# Patient Record
Sex: Female | Born: 1970
Health system: Southern US, Community
[De-identification: ages and names within clinical notes are randomized; demographics above are authoritative.]

## PROBLEM LIST (undated history)

## (undated) DIAGNOSIS — R51 Headache: Secondary | ICD-10-CM

## (undated) DIAGNOSIS — O24419 Gestational diabetes mellitus in pregnancy, unspecified control: Secondary | ICD-10-CM

## (undated) DIAGNOSIS — I10 Essential (primary) hypertension: Secondary | ICD-10-CM

## (undated) DIAGNOSIS — R519 Headache, unspecified: Secondary | ICD-10-CM

## (undated) DIAGNOSIS — G4762 Sleep related leg cramps: Secondary | ICD-10-CM

## (undated) HISTORY — PX: OTHER SURGICAL HISTORY: SHX169

## (undated) HISTORY — DX: Gestational diabetes mellitus in pregnancy, unspecified control: O24.419

## (undated) HISTORY — DX: Essential (primary) hypertension: I10

---
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
## 1999-03-15 ENCOUNTER — Emergency Department (HOSPITAL_COMMUNITY): Admission: EM | Admit: 1999-03-15 | Discharge: 1999-03-16 | Payer: Self-pay | Admitting: Emergency Medicine

## 1999-03-15 ENCOUNTER — Emergency Department (HOSPITAL_COMMUNITY): Admission: EM | Admit: 1999-03-15 | Discharge: 1999-03-15 | Payer: Self-pay | Admitting: Emergency Medicine

## 2001-02-04 ENCOUNTER — Inpatient Hospital Stay (HOSPITAL_COMMUNITY): Admission: AD | Admit: 2001-02-04 | Discharge: 2001-02-04 | Payer: Self-pay | Admitting: Obstetrics

## 2002-06-07 ENCOUNTER — Emergency Department (HOSPITAL_COMMUNITY): Admission: EM | Admit: 2002-06-07 | Discharge: 2002-06-08 | Payer: Self-pay | Admitting: Emergency Medicine

## 2002-06-11 ENCOUNTER — Observation Stay (HOSPITAL_COMMUNITY): Admission: AD | Admit: 2002-06-11 | Discharge: 2002-06-12 | Payer: Self-pay | Admitting: *Deleted

## 2002-08-19 ENCOUNTER — Ambulatory Visit (HOSPITAL_COMMUNITY): Admission: RE | Admit: 2002-08-19 | Discharge: 2002-08-19 | Payer: Self-pay | Admitting: *Deleted

## 2002-08-19 ENCOUNTER — Encounter: Payer: Self-pay | Admitting: *Deleted

## 2002-09-21 ENCOUNTER — Ambulatory Visit (HOSPITAL_COMMUNITY): Admission: RE | Admit: 2002-09-21 | Discharge: 2002-09-21 | Payer: Self-pay | Admitting: *Deleted

## 2002-09-21 ENCOUNTER — Encounter: Payer: Self-pay | Admitting: *Deleted

## 2002-11-01 ENCOUNTER — Encounter: Payer: Self-pay | Admitting: *Deleted

## 2002-11-01 ENCOUNTER — Ambulatory Visit (HOSPITAL_COMMUNITY): Admission: RE | Admit: 2002-11-01 | Discharge: 2002-11-01 | Payer: Self-pay | Admitting: *Deleted

## 2002-12-03 ENCOUNTER — Ambulatory Visit (HOSPITAL_COMMUNITY): Admission: RE | Admit: 2002-12-03 | Discharge: 2002-12-03 | Payer: Self-pay | Admitting: *Deleted

## 2002-12-03 ENCOUNTER — Encounter: Payer: Self-pay | Admitting: *Deleted

## 2002-12-29 ENCOUNTER — Ambulatory Visit (HOSPITAL_COMMUNITY): Admission: AD | Admit: 2002-12-29 | Discharge: 2002-12-29 | Payer: Self-pay | Admitting: *Deleted

## 2002-12-31 ENCOUNTER — Ambulatory Visit (HOSPITAL_COMMUNITY): Admission: AD | Admit: 2002-12-31 | Discharge: 2002-12-31 | Payer: Self-pay | Admitting: *Deleted

## 2003-01-07 ENCOUNTER — Ambulatory Visit (HOSPITAL_COMMUNITY): Admission: AD | Admit: 2003-01-07 | Discharge: 2003-01-08 | Payer: Self-pay | Admitting: *Deleted

## 2003-01-08 ENCOUNTER — Inpatient Hospital Stay (HOSPITAL_COMMUNITY): Admission: RE | Admit: 2003-01-08 | Discharge: 2003-01-11 | Payer: Self-pay | Admitting: *Deleted

## 2003-01-28 ENCOUNTER — Emergency Department (HOSPITAL_COMMUNITY): Admission: EM | Admit: 2003-01-28 | Discharge: 2003-01-28 | Payer: Self-pay | Admitting: Emergency Medicine

## 2003-01-28 ENCOUNTER — Encounter: Payer: Self-pay | Admitting: *Deleted

## 2003-02-07 ENCOUNTER — Emergency Department (HOSPITAL_COMMUNITY): Admission: EM | Admit: 2003-02-07 | Discharge: 2003-02-07 | Payer: Self-pay | Admitting: Emergency Medicine

## 2003-06-21 ENCOUNTER — Ambulatory Visit (HOSPITAL_COMMUNITY): Admission: RE | Admit: 2003-06-21 | Discharge: 2003-06-21 | Payer: Self-pay | Admitting: Family Medicine

## 2013-03-31 ENCOUNTER — Telehealth: Payer: Self-pay | Admitting: Family Medicine

## 2013-03-31 MED ORDER — ONDANSETRON HCL 8 MG PO TABS: 8.0000 mg | ORAL_TABLET | Freq: Three times a day (TID) | ORAL | Status: DC | PRN

## 2013-03-31 NOTE — Telephone Encounter (Signed)
Patient wants to know if you will prescribe her some medication for the nausea she is experiencing due to the hydrocodone she is taking. [facility] [place]

## 2013-03-31 NOTE — Telephone Encounter (Signed)
zofran 8 mg one tid prn ,#21,2 refills, follow up if ongoing

## 2013-03-31 NOTE — Telephone Encounter (Signed)
Medication sent to pharmacy. Patient was notified.  

## 2013-03-31 NOTE — Telephone Encounter (Signed)
Left message on voicemail to return call.

## 2013-03-31 NOTE — Telephone Encounter (Signed)
Wants to talk with a Nurse regarding her past ER visit.  She is currently taking Hydrocodone and it making her sick.  She fell and injured her right foot.  Went to [facility].  Please call Patient. Thanks

## 2013-04-01 ENCOUNTER — Other Ambulatory Visit: Payer: Self-pay | Admitting: *Deleted

## 2013-04-01 MED ORDER — PROMETHAZINE HCL 25 MG PO TABS: 25.0000 mg | ORAL_TABLET | Freq: Three times a day (TID) | ORAL | Status: DC | PRN

## 2013-04-01 NOTE — Telephone Encounter (Signed)
Patient called back stating she could not afford Zofran. Phenergan was prescribed and sent in by the nurse.

## 2016-03-19 ENCOUNTER — Encounter: Payer: Self-pay | Admitting: Family Medicine

## 2016-03-19 ENCOUNTER — Ambulatory Visit (INDEPENDENT_AMBULATORY_CARE_PROVIDER_SITE_OTHER): Payer: Self-pay | Admitting: Family Medicine

## 2016-03-19 VITALS — BP 120/90 | Ht 62.0 in | Wt 137.0 lb

## 2016-03-19 DIAGNOSIS — R079 Chest pain, unspecified: Secondary | ICD-10-CM

## 2016-03-19 DIAGNOSIS — Z13220 Encounter for screening for lipoid disorders: Secondary | ICD-10-CM

## 2016-03-19 NOTE — Progress Notes (Signed)
   Subjective:    Patient ID: Samantha Moreno, female    DOB: Nov 10, 1970, 45 y.o.   MRN: [id number]  Chest Pain   This is a new problem. Episode onset: one month. Radiates to: left arm numbness, fatigue, palpitations, shaky,  headache. Pertinent negatives include no cough, fever, headaches or shortness of breath.   Patient states at times she has tightness in her chest radiates into the left chest indication he makes C-arm feel funny. She denies any nausea vomiting diarrhea fever chills or sweats.  She has a strong family history of premature heart disease her sister died at age 54 her mother had bypass in her 32s her dad had a heart attack in his 19s the patient does not smoke or drink.   Review of Systems  Constitutional: Negative for activity change, fatigue and fever.  Respiratory: Negative for cough and shortness of breath.   Cardiovascular: Positive for chest pain. Negative for leg swelling.  Neurological: Negative for headaches.       Objective:   Physical Exam  Constitutional: She appears well-nourished. No distress.  Cardiovascular: Normal rate, regular rhythm and normal heart sounds.   No murmur heard. Pulmonary/Chest: Effort normal and breath sounds normal. No respiratory distress.  Musculoskeletal: She exhibits no edema.  Lymphadenopathy:    She has no cervical adenopathy.  Neurological: She is alert. She exhibits normal muscle tone.  Psychiatric: Her behavior is normal.  Vitals reviewed.         Assessment & Plan:  Chest pain although cardiac is less likely it could well be stress, because of family history and her symptoms referral to cardiology for further evaluation and stress testing is recommended.  Borderline blood pressure low-salt diet regular physical activity once she is cardiac cleared. Monitor blood pressure periodically at home if it is staying in the 90s or if the top number gets in one dirty's/140s follow-up sooner  Lipid profile ordered await  results

## 2016-03-19 NOTE — Patient Instructions (Signed)
DASH Eating Plan  DASH stands for "Dietary Approaches to Stop Hypertension." The DASH eating plan is a healthy eating plan that has been shown to reduce high blood pressure (hypertension). Additional health benefits may include reducing the risk of type 2 diabetes mellitus, heart disease, and stroke. The DASH eating plan may also help with weight loss.  WHAT DO I NEED TO KNOW ABOUT THE DASH EATING PLAN?  For the DASH eating plan, you will follow these general guidelines:  · Choose foods with a percent daily value for sodium of less than 5% (as listed on the food label).  · Use salt-free seasonings or herbs instead of table salt or sea salt.  · Check with your health care provider or pharmacist before using salt substitutes.  · Eat lower-sodium products, often labeled as "lower sodium" or "no salt added."  · Eat fresh foods.  · Eat more vegetables, fruits, and low-fat dairy products.  · Choose whole grains. Look for the word "whole" as the first word in the ingredient list.  · Choose fish and skinless chicken or turkey more often than red meat. Limit fish, poultry, and meat to 6 oz (170 g) each day.  · Limit sweets, desserts, sugars, and sugary drinks.  · Choose heart-healthy fats.  · Limit cheese to 1 oz (28 g) per day.  · Eat more home-cooked food and less restaurant, buffet, and fast food.  · Limit fried foods.  · Cook foods using methods other than frying.  · Limit canned vegetables. If you do use them, rinse them well to decrease the sodium.  · When eating at a restaurant, ask that your food be prepared with less salt, or no salt if possible.  WHAT FOODS CAN I EAT?  Seek help from a dietitian for individual calorie needs.  Grains  Whole grain or whole wheat bread. Brown rice. Whole grain or whole wheat pasta. Quinoa, bulgur, and whole grain cereals. Low-sodium cereals. Corn or whole wheat flour tortillas. Whole grain cornbread. Whole grain crackers. Low-sodium crackers.  Vegetables  Fresh or frozen vegetables  (raw, steamed, roasted, or grilled). Low-sodium or reduced-sodium tomato and vegetable juices. Low-sodium or reduced-sodium tomato sauce and paste. Low-sodium or reduced-sodium canned vegetables.   Fruits  All fresh, canned (in natural juice), or frozen fruits.  Meat and Other Protein Products  Ground beef (85% or leaner), grass-fed beef, or beef trimmed of fat. Skinless chicken or turkey. Ground chicken or turkey. Pork trimmed of fat. All fish and seafood. Eggs. Dried beans, peas, or lentils. Unsalted nuts and seeds. Unsalted canned beans.  Dairy  Low-fat dairy products, such as skim or 1% milk, 2% or reduced-fat cheeses, low-fat ricotta or cottage cheese, or plain low-fat yogurt. Low-sodium or reduced-sodium cheeses.  Fats and Oils  Tub margarines without trans fats. Light or reduced-fat mayonnaise and salad dressings (reduced sodium). Avocado. Safflower, olive, or canola oils. Natural peanut or almond butter.  Other  Unsalted popcorn and pretzels.  The items listed above may not be a complete list of recommended foods or beverages. Contact your dietitian for more options.  WHAT FOODS ARE NOT RECOMMENDED?  Grains  White bread. White pasta. White rice. Refined cornbread. Bagels and croissants. Crackers that contain trans fat.  Vegetables  Creamed or fried vegetables. Vegetables in a cheese sauce. Regular canned vegetables. Regular canned tomato sauce and paste. Regular tomato and vegetable juices.  Fruits  Dried fruits. Canned fruit in light or heavy syrup. Fruit juice.  Meat and Other Protein   Products  Fatty cuts of meat. Ribs, chicken wings, bacon, sausage, bologna, salami, chitterlings, fatback, hot dogs, bratwurst, and packaged luncheon meats. Salted nuts and seeds. Canned beans with salt.  Dairy  Whole or 2% milk, cream, half-and-half, and cream cheese. Whole-fat or sweetened yogurt. Full-fat cheeses or blue cheese. Nondairy creamers and whipped toppings. Processed cheese, cheese spreads, or cheese  curds.  Condiments  Onion and garlic salt, seasoned salt, table salt, and sea salt. Canned and packaged gravies. Worcestershire sauce. Tartar sauce. Barbecue sauce. Teriyaki sauce. Soy sauce, including reduced sodium. Steak sauce. Fish sauce. Oyster sauce. Cocktail sauce. Horseradish. Ketchup and mustard. Meat flavorings and tenderizers. Bouillon cubes. Hot sauce. Tabasco sauce. Marinades. Taco seasonings. Relishes.  Fats and Oils  Butter, stick margarine, lard, shortening, ghee, and bacon fat. Coconut, palm kernel, or palm oils. Regular salad dressings.  Other  Pickles and olives. Salted popcorn and pretzels.  The items listed above may not be a complete list of foods and beverages to avoid. Contact your dietitian for more information.  WHERE CAN I FIND MORE INFORMATION?  National Heart, Lung, and Blood Institute: www.nhlbi.nih.gov/health/health-topics/topics/dash/     This information is not intended to replace advice given to you by your health care provider. Make sure you discuss any questions you have with your health care provider.     Document Released: 05/16/2011 Document Revised: 06/17/2014 Document Reviewed: 03/31/2013  Elsevier Interactive Patient Education ©2016 Elsevier Inc.

## 2016-03-21 ENCOUNTER — Telehealth: Payer: Self-pay | Admitting: Family Medicine

## 2016-03-21 ENCOUNTER — Encounter: Payer: Self-pay | Admitting: Family Medicine

## 2016-03-21 DIAGNOSIS — E785 Hyperlipidemia, unspecified: Secondary | ICD-10-CM | POA: Insufficient documentation

## 2016-03-21 DIAGNOSIS — R7301 Impaired fasting glucose: Secondary | ICD-10-CM | POA: Insufficient documentation

## 2016-03-21 LAB — LIPID PANEL
Chol/HDL Ratio: 5.4 ratio units — ABNORMAL HIGH (ref 0.0–4.4)
Cholesterol, Total: 189 mg/dL (ref 100–199)
HDL: 35 mg/dL — ABNORMAL LOW (ref >39)
LDL Calculated: 115 mg/dL — ABNORMAL HIGH (ref 0–99)
Triglycerides: 193 mg/dL — ABNORMAL HIGH (ref 0–149)
VLDL Cholesterol Cal: 39 mg/dL (ref 5–40)

## 2016-03-21 LAB — BASIC METABOLIC PANEL
BUN/Creatinine Ratio: 14 (ref 9–23)
BUN: 10 mg/dL (ref 6–24)
CO2: 24 mmol/L (ref 18–29)
Calcium: 8.9 mg/dL (ref 8.7–10.2)
Chloride: 104 mmol/L (ref 96–106)
Creatinine, Ser: 0.7 mg/dL (ref 0.57–1.00)
GFR calc Af Amer: 121 mL/min/{1.73_m2} (ref >59)
GFR calc non Af Amer: 105 mL/min/{1.73_m2} (ref >59)
Glucose: 105 mg/dL — ABNORMAL HIGH (ref 65–99)
Potassium: 4.4 mmol/L (ref 3.5–5.2)
Sodium: 142 mmol/L (ref 134–144)

## 2016-03-21 NOTE — Telephone Encounter (Signed)
See result notes please

## 2016-03-21 NOTE — Telephone Encounter (Signed)
Requesting results to lab work completed on 03/20/16.

## 2016-03-22 ENCOUNTER — Encounter: Payer: Self-pay | Admitting: Family Medicine

## 2016-03-26 ENCOUNTER — Encounter: Payer: Self-pay | Admitting: Family Medicine

## 2016-04-15 NOTE — Progress Notes (Signed)
Cardiology Office Note   Date:  04/16/2016   ID:  Samantha Moreno, DOB Oct 17, 1970, MRN [id number]  PCP:  Sallee Lange, MD  Cardiologist:   Jenkins Rouge, MD   No chief complaint on file.     History of Present Illness: Samantha Moreno is a 45 y.o. female who presents for evaluation of atypical chest pain.  Seen by Dr Wolfgang Phoenix on 03/19/16  Pain for a month  With radiation to left arm numbness , fatigue and palpitations She has a family history of CAD sister died at age 16 mom had bypass in her 14's and dad with MI in 69's  BP  Has been borderline Labs reviewed and LDL 115 Triglycerides 193 TC 189  She has a sedentary job at [employer] not exertional. Last less than a minute. Has Not been as bad last few weeks.   Past Medical History:  Diagnosis Date  . Gestational diabetes   . HTN (hypertension)     Past Surgical History:  Procedure Laterality Date  . laproscopy  1992/1993     Current Outpatient Prescriptions  Medication Sig Dispense Refill  . aspirin EC 81 MG tablet Take 81 mg by mouth daily.    Marland Kitchen ibuprofen (ADVIL,MOTRIN) 200 MG tablet Take 200 mg by mouth every 6 (six) hours as needed.     No current facility-administered medications for this visit.     Allergies:   Patient has no known allergies.    Social History:  The patient  reports that she quit smoking about 25 years ago. She has never used smokeless tobacco. She reports that she does not drink alcohol or use drugs.   Family History:  The patient's family history includes Diabetes in her father; Heart attack in her sister; Hypertension in her father; Stroke in her father and mother.    ROS:  Please see the history of present illness.   Otherwise, review of systems are positive for none.   All other systems are reviewed and negative.    PHYSICAL EXAM: VS:  BP 132/84 (BP Location: Left Arm)   Pulse 97   Ht 5\' 2"  (1.575 m)   Wt 62.1 kg (137 lb)   SpO2 99%   BMI 25.06 kg/m  , BMI Body  mass index is 25.06 kg/m. Affect appropriate Healthy:  appears stated age 2: normal Neck supple with no adenopathy JVP normal no bruits no thyromegaly Lungs clear with no wheezing and good diaphragmatic motion Heart:  S1/S2 no murmur, no rub, gallop or click PMI normal Abdomen: benighn, BS positve, no tenderness, no AAA no bruit.  No HSM or HJR Distal pulses intact with no bruits No edema Neuro non-focal Skin warm and dry No muscular weakness    EKG:  03/19/16  SR nonspecific ST T wave changes    Recent Labs: 03/20/2016: BUN 10; Creatinine, Ser 0.70; Potassium 4.4; Sodium 142    Lipid Panel    Component Value Date/Time   CHOL 189 03/20/2016 0821   TRIG 193 (H) 03/20/2016 0821   HDL 35 (L) 03/20/2016 0821   CHOLHDL 5.4 (H) 03/20/2016 0821   LDLCALC 115 (H) 03/20/2016 0821      Wt Readings from Last 3 Encounters:  04/16/16 62.1 kg (137 lb)  03/19/16 62.1 kg (137 lb)      Other studies Reviewed: Additional studies/ records that were reviewed today include: notes , ECG dr Alric Quan records, cath notes In Epic .    ASSESSMENT AND  PLAN:  1.  Chest Pain: very atypical seems more worried about family history I reviewed records from her sister who died In 05-May-2015 Cared for by Dr Tamala Julian Had cardiac arrest with BMS to RCA but note made of documented coronary Artery spasm.  Patient has no insurance. Do not want to order expensive tests Best way to risk stratify for CAD is calcium score and ETT.  Nitro given for any prolonged episodes of pain.   2. Chol:  Would not Rx unless calcium score high diet Rx  3. BP:  Diet and exercise low sodium    Current medicines are reviewed at length with the patient today.  The patient does not have concerns regarding medicines.  The following changes have been made:  PRN nitro   Labs/ tests ordered today include: Calcium Score ETT No orders of the defined types were placed in this encounter.    Disposition:   FU  with [place] in a year      Signed, Jenkins Rouge, MD  04/16/2016 10:04 AM    [facility] [address], [place], [place]  [address] Phone: [phone]; Fax: [phone]

## 2016-04-16 ENCOUNTER — Encounter: Payer: Self-pay | Admitting: Cardiovascular Disease

## 2016-04-16 ENCOUNTER — Ambulatory Visit (INDEPENDENT_AMBULATORY_CARE_PROVIDER_SITE_OTHER): Payer: Self-pay | Admitting: Cardiovascular Disease

## 2016-04-16 VITALS — BP 132/84 | HR 97 | Ht 62.0 in | Wt 137.0 lb

## 2016-04-16 DIAGNOSIS — R079 Chest pain, unspecified: Secondary | ICD-10-CM

## 2016-04-16 MED ORDER — NITROGLYCERIN 0.4 MG SL SUBL
0.4000 mg | SUBLINGUAL_TABLET | SUBLINGUAL | 3 refills | Status: DC | PRN
Start: 2016-04-16 — End: 2016-11-15

## 2016-04-16 NOTE — Patient Instructions (Signed)
Medication Instructions:  NITRO- 4 MG ( TAKE AS NEEDED FOR CHEST PAIN ) - 1 TABLET UNDER TONGUE 5 MINUTES APART FOR UP TO 3 TIMES   Labwork: NONE  Testing/Procedures: Your physician has requested that you have an exercise tolerance test. For further information please visit [email or web address]. Please also follow instruction sheet, as given.   Cardiac Calcium score ( to be done at [facility] in [place] )     Follow-Up: Your physician recommends that you schedule a follow-up appointment in: as needed    Any Other Special Instructions Will Be Listed Below (If Applicable).     If you need a refill on your cardiac medications before your next appointment, please call your pharmacy.

## 2016-04-18 ENCOUNTER — Ambulatory Visit (HOSPITAL_COMMUNITY)
Admission: RE | Admit: 2016-04-18 | Discharge: 2016-04-18 | Disposition: A | Discharge status: Home / Self Care | Payer: Self-pay | Source: Ambulatory Visit | Attending: Cardiovascular Disease | Admitting: Cardiovascular Disease

## 2016-04-18 DIAGNOSIS — R079 Chest pain, unspecified: Secondary | ICD-10-CM

## 2016-04-18 LAB — EXERCISE TOLERANCE TEST
Estimated workload: 7 METS
Exercise duration (min): 4 min
Exercise duration (sec): 17 s
MPHR: 175 {beats}/min
Peak HR: 157 {beats}/min
Percent HR: 89 %
RPE: 15
Rest HR: 93 {beats}/min

## 2016-04-19 ENCOUNTER — Encounter: Payer: Self-pay | Admitting: Cardiovascular Disease

## 2016-04-23 ENCOUNTER — Ambulatory Visit (INDEPENDENT_AMBULATORY_CARE_PROVIDER_SITE_OTHER)
Admission: RE | Admit: 2016-04-23 | Discharge: 2016-04-23 | Disposition: A | Discharge status: Home / Self Care | Payer: Self-pay | Source: Ambulatory Visit | Attending: Cardiovascular Disease | Admitting: Cardiovascular Disease

## 2016-04-23 ENCOUNTER — Encounter: Payer: Self-pay | Admitting: Cardiovascular Disease

## 2016-04-23 ENCOUNTER — Encounter: Payer: Self-pay | Admitting: Radiology

## 2016-04-23 DIAGNOSIS — R079 Chest pain, unspecified: Secondary | ICD-10-CM

## 2016-04-24 ENCOUNTER — Encounter: Payer: Self-pay | Admitting: Family Medicine

## 2016-04-24 DIAGNOSIS — R0789 Other chest pain: Secondary | ICD-10-CM | POA: Insufficient documentation

## 2016-04-30 NOTE — Telephone Encounter (Signed)
I attempted to contact pt by telephone, LMTCB at [phone]  I spoke with patient-pt states last night while cooking, she experienced chest pain starting on left side moving to the right side of her chest feeling like the right side of her chest was constricting.  Pt states the pain lasted about 2 minutes, pt states she took 1 NTG with almost immediate relief.  Pt denies any other symptoms except some mild lightheadedness.  Pt states she is concerned about her symptoms last night because of sister's medical history of coronary artery spasm and death. Pt is asking if she needs further evaluation of her symptoms. Pt states she is having some minor chest discomfort this morning but does not feel severe enough to take NTG.  Pt advised I will forward to Dr Johnsie Cancel for review.

## 2016-10-25 ENCOUNTER — Ambulatory Visit (INDEPENDENT_AMBULATORY_CARE_PROVIDER_SITE_OTHER): Payer: BLUE CROSS/BLUE SHIELD | Admitting: Nurse Practitioner

## 2016-10-25 ENCOUNTER — Encounter: Payer: Self-pay | Admitting: Nurse Practitioner

## 2016-10-25 VITALS — BP 182/102 | Ht 62.0 in | Wt 147.0 lb

## 2016-10-25 DIAGNOSIS — I10 Essential (primary) hypertension: Secondary | ICD-10-CM

## 2016-10-25 DIAGNOSIS — D649 Anemia, unspecified: Secondary | ICD-10-CM | POA: Diagnosis not present

## 2016-10-25 DIAGNOSIS — R5383 Other fatigue: Secondary | ICD-10-CM

## 2016-10-25 DIAGNOSIS — Z634 Disappearance and death of family member: Secondary | ICD-10-CM

## 2016-10-25 DIAGNOSIS — R7301 Impaired fasting glucose: Secondary | ICD-10-CM | POA: Diagnosis not present

## 2016-10-25 DIAGNOSIS — Z1231 Encounter for screening mammogram for malignant neoplasm of breast: Secondary | ICD-10-CM

## 2016-10-25 DIAGNOSIS — E785 Hyperlipidemia, unspecified: Secondary | ICD-10-CM | POA: Diagnosis not present

## 2016-10-25 DIAGNOSIS — Z1151 Encounter for screening for human papillomavirus (HPV): Secondary | ICD-10-CM

## 2016-10-25 DIAGNOSIS — Z124 Encounter for screening for malignant neoplasm of cervix: Secondary | ICD-10-CM

## 2016-10-25 DIAGNOSIS — Z01419 Encounter for gynecological examination (general) (routine) without abnormal findings: Secondary | ICD-10-CM

## 2016-10-25 MED ORDER — METOPROLOL SUCCINATE ER 25 MG PO TB24: 25.0000 mg | ORAL_TABLET | Freq: Every day | ORAL | 2 refills | Status: DC

## 2016-10-26 ENCOUNTER — Encounter: Payer: Self-pay | Admitting: Nurse Practitioner

## 2016-10-26 DIAGNOSIS — I10 Essential (primary) hypertension: Secondary | ICD-10-CM | POA: Insufficient documentation

## 2016-10-26 DIAGNOSIS — Z634 Disappearance and death of family member: Secondary | ICD-10-CM | POA: Insufficient documentation

## 2016-10-26 LAB — HEPATIC FUNCTION PANEL
ALT: 30 IU/L (ref 0–32)
AST: 33 IU/L (ref 0–40)
Albumin: 4.2 g/dL (ref 3.5–5.5)
Alkaline Phosphatase: 140 IU/L — ABNORMAL HIGH (ref 39–117)
Bilirubin Total: 0.4 mg/dL (ref 0.0–1.2)
Bilirubin, Direct: 0.11 mg/dL (ref 0.00–0.40)
Total Protein: 7.1 g/dL (ref 6.0–8.5)

## 2016-10-26 LAB — CBC WITH DIFFERENTIAL/PLATELET
BASOS ABS: 0.1 10*3/uL (ref 0.0–0.2)
Basos: 1 %
EOS (ABSOLUTE): 0.2 10*3/uL (ref 0.0–0.4)
Eos: 2 %
HEMATOCRIT: 27.5 % — AB (ref 34.0–46.6)
Hemoglobin: 7.6 g/dL — ABNORMAL LOW (ref 11.1–15.9)
IMMATURE GRANS (ABS): 0 10*3/uL (ref 0.0–0.1)
Immature Granulocytes: 0 %
Lymphocytes Absolute: 2.7 10*3/uL (ref 0.7–3.1)
Lymphs: 32 %
MCH: 18.1 pg — ABNORMAL LOW (ref 26.6–33.0)
MCHC: 27.6 g/dL — ABNORMAL LOW (ref 31.5–35.7)
MCV: 65 fL — ABNORMAL LOW (ref 79–97)
Monocytes Absolute: 0.9 10*3/uL (ref 0.1–0.9)
Monocytes: 11 %
NEUTROS ABS: 4.6 10*3/uL (ref 1.4–7.0)
Neutrophils: 54 %
Platelets: 539 10*3/uL — ABNORMAL HIGH (ref 150–379)
RBC: 4.21 x10E6/uL (ref 3.77–5.28)
RDW: 18.5 % — ABNORMAL HIGH (ref 12.3–15.4)
WBC: 8.4 10*3/uL (ref 3.4–10.8)

## 2016-10-26 LAB — LIPID PANEL
CHOL/HDL RATIO: 4.8 ratio — AB (ref 0.0–4.4)
Cholesterol, Total: 160 mg/dL (ref 100–199)
HDL: 33 mg/dL — ABNORMAL LOW (ref >39)
LDL Calculated: 90 mg/dL (ref 0–99)
Triglycerides: 185 mg/dL — ABNORMAL HIGH (ref 0–149)
VLDL CHOLESTEROL CAL: 37 mg/dL (ref 5–40)

## 2016-10-26 LAB — BASIC METABOLIC PANEL
BUN/Creatinine Ratio: 16 (ref 9–23)
BUN: 10 mg/dL (ref 6–24)
CO2: 21 mmol/L (ref 18–29)
Calcium: 8.8 mg/dL (ref 8.7–10.2)
Chloride: 104 mmol/L (ref 96–106)
Creatinine, Ser: 0.63 mg/dL (ref 0.57–1.00)
GFR calc Af Amer: 124 mL/min/{1.73_m2} (ref >59)
GFR calc non Af Amer: 108 mL/min/{1.73_m2} (ref >59)
Glucose: 96 mg/dL (ref 65–99)
Potassium: 4.5 mmol/L (ref 3.5–5.2)
Sodium: 139 mmol/L (ref 134–144)

## 2016-10-26 LAB — TSH: TSH: 5.08 u[IU]/mL — ABNORMAL HIGH (ref 0.450–4.500)

## 2016-10-26 LAB — VITAMIN D 25 HYDROXY (VIT D DEFICIENCY, FRACTURES): VIT D 25 HYDROXY: 12.8 ng/mL — AB (ref 30.0–100.0)

## 2016-10-26 NOTE — Progress Notes (Signed)
Subjective:    Patient ID: Samantha Moreno, female    DOB: 1970/09/13, 46 y.o.   MRN: [id number]  HPI Presents for her wellness exam. Same sexual partner for over 20 years. Regular cycles, slightly heavy flow lasting 5-7 days. Regular vision and dental exams. Occasional mild swelling of the ankles at night with some leg cramps. Has a recent work up for chest pain with Dr. Johnsie Cancel including a normal stress test. Has been getting elevated BPs at home with her machine from 147/90 to 167/101. Also tends to have elevated pulse greater than 100. Was on Lisinopril at one point for BP but stopped due to fatigue. NTG helped chest pain, rarely uses it. No overt reflux symptoms. Has issues with constipation especially when stressed. No family history of colon cancer. Over the past year has lost her sister and her father. Has not found a grief support group that fits her work schedule. Does not take birth control; had trouble conceiving so defers contraceptives.  Denies suicidal or homicidal thoughts or ideation.    Review of Systems  Constitutional: Positive for fatigue. Negative for activity change and appetite change.  HENT: Negative for dental problem, ear pain, sinus pressure and sore throat.   Respiratory: Negative for cough, chest tightness, shortness of breath and wheezing.   Cardiovascular: Positive for chest pain and palpitations.  Gastrointestinal: Positive for constipation. Negative for abdominal distention, abdominal pain, diarrhea, nausea and vomiting.  Genitourinary: Negative for difficulty urinating, dysuria, enuresis, frequency, genital sores, menstrual problem, pelvic pain, urgency and vaginal discharge.  Psychiatric/Behavioral: Positive for dysphoric mood. Negative for suicidal ideas.       Objective:   Physical Exam  Constitutional: She is oriented to person, place, and time. She appears well-developed. No distress.  HENT:  Right Ear: External ear normal.  Left Ear: External ear  normal.  Mouth/Throat: Oropharynx is clear and moist.  Neck: Normal range of motion. Neck supple. No tracheal deviation present. No thyromegaly present.  Cardiovascular: Normal rate, regular rhythm and normal heart sounds.  Exam reveals no gallop.   No murmur heard. Pulmonary/Chest: Effort normal and breath sounds normal.  Abdominal: Soft. She exhibits no distension and no mass. There is tenderness. There is no rebound and no guarding.  Mild epigastric area tenderness. Minimal lower abdominal tenderness.   Genitourinary: Vagina normal and uterus normal. No vaginal discharge found.  Genitourinary Comments: External GU: no rashes or lesions. Vagina: no discharge. Cervix normal in appearance. No CMT. Bimanual exam: no tenderness or obvious masses.   Musculoskeletal: She exhibits no edema.  Lymphadenopathy:    She has no cervical adenopathy.  Neurological: She is alert and oriented to person, place, and time.  Skin: Skin is warm and dry. No rash noted.  Psychiatric: She has a normal mood and affect. Her behavior is normal.  Vitals reviewed. Breast exam: no masses; axillae no adenopathy,.         Assessment & Plan:   Problem List Items Addressed This Visit      Cardiovascular and Mediastinum   Essential hypertension   Relevant Medications   metoprolol succinate (TOPROL-XL) 25 MG 24 hr tablet   Other Relevant Orders   Basic metabolic panel (Completed)     Other   Bereavement   Fasting hyperglycemia   Hyperlipidemia   Relevant Medications   metoprolol succinate (TOPROL-XL) 25 MG 24 hr tablet   Other Relevant Orders   Lipid panel (Completed)   Hepatic function panel (Completed)    Other Visit  Diagnoses    Well woman exam    -  Primary   Relevant Orders   Pap IG and HPV (high risk) DNA detection   Screening for cervical cancer       Relevant Orders   Pap IG and HPV (high risk) DNA detection   Screening for HPV (human papillomavirus)       Relevant Orders   Pap IG and HPV  (high risk) DNA detection   Fatigue, unspecified type       Relevant Orders   CBC with Differential/Platelet (Completed)   TSH (Completed)   VITAMIN D 25 Hydroxy (Vit-D Deficiency, Fractures) (Completed)   Screening mammogram, encounter for       Relevant Orders   MM DIGITAL SCREENING BILATERAL     Recommend grief counseling either group or individual. Due to elevated BP and HR, start low dose Toprol. Patient to monitor BP and HR at home. Call back in 2 weeks if BP remains above 140/90. Discussed importance of regular activity and stress reduction. Labs pending. Also explained she may be having esophageal spasm which may improve with use of NTG.  Return in about 1 month (around 11/25/2016) for BP recheck.  Will review issues at next visit and discuss options.

## 2016-10-28 ENCOUNTER — Other Ambulatory Visit: Payer: Self-pay | Admitting: Nurse Practitioner

## 2016-10-28 ENCOUNTER — Encounter: Payer: Self-pay | Admitting: Nurse Practitioner

## 2016-10-28 ENCOUNTER — Telehealth: Payer: Self-pay | Admitting: Nurse Practitioner

## 2016-10-28 DIAGNOSIS — D509 Iron deficiency anemia, unspecified: Secondary | ICD-10-CM

## 2016-10-28 DIAGNOSIS — E559 Vitamin D deficiency, unspecified: Secondary | ICD-10-CM

## 2016-10-28 MED ORDER — VITAMIN D (ERGOCALCIFEROL) 1.25 MG (50000 UNIT) PO CAPS: 50000.0000 [IU] | ORAL_CAPSULE | ORAL | 2 refills | Status: DC

## 2016-10-28 NOTE — Addendum Note (Signed)
Addended by: Dairl Ponder on: 10/28/2016 11:27 AM   Modules accepted: Orders

## 2016-10-28 NOTE — Telephone Encounter (Signed)
Patient is aware. Spoke with the nurse earlier and note through my chart.

## 2016-10-28 NOTE — Telephone Encounter (Signed)
Patient is requesting Samantha Moreno to call her back to discuss her recent test results.  She said she saw on MyChart, but doesn't understand it.

## 2016-10-28 NOTE — Telephone Encounter (Signed)
See my chart message

## 2016-10-29 LAB — PAP IG AND HPV HIGH-RISK
HPV, high-risk: NEGATIVE
PAP SMEAR COMMENT: 0

## 2016-10-30 LAB — IRON AND TIBC
IRON: 276 ug/dL — AB (ref 27–159)
Iron Saturation: 51 % (ref 15–55)
Total Iron Binding Capacity: 541 ug/dL — ABNORMAL HIGH (ref 250–450)
UIBC: 265 ug/dL (ref 131–425)

## 2016-10-30 LAB — FERRITIN: Ferritin: 5 ng/mL — ABNORMAL LOW (ref 15–150)

## 2016-11-05 ENCOUNTER — Encounter: Payer: Self-pay | Admitting: Nurse Practitioner

## 2016-11-06 ENCOUNTER — Encounter: Payer: Self-pay | Admitting: Nurse Practitioner

## 2016-11-08 ENCOUNTER — Encounter: Payer: Self-pay | Admitting: Family Medicine

## 2016-11-08 ENCOUNTER — Other Ambulatory Visit: Payer: Self-pay | Admitting: Nurse Practitioner

## 2016-11-08 DIAGNOSIS — D509 Iron deficiency anemia, unspecified: Secondary | ICD-10-CM

## 2016-11-12 ENCOUNTER — Telehealth: Payer: Self-pay | Admitting: Family Medicine

## 2016-11-12 NOTE — Telephone Encounter (Signed)
This is very common as we go through "the change". If she is sexually active, make sure there is some form of birth control including vasectomy or tubal ligation.

## 2016-11-12 NOTE — Telephone Encounter (Signed)
Spoke with patient and informed her per Linzie Collin- This is very common as we go through "the change". If she is sexually active, make sure there is some form of birth control including vasectomy or tubal ligation. Patient verbalized understanding.

## 2016-11-12 NOTE — Telephone Encounter (Signed)
Patient just wanted to talk to the nurse about her period. She is 3 days late and having bad cramps. She is usually pretty regular. Just wants advice, already has appt with Hoyle Sauer on Friday.

## 2016-11-12 NOTE — Telephone Encounter (Signed)
Patient states she has always been regular but was due to start 3 days ago and has been having really bad stomach aches and cramps

## 2016-11-15 ENCOUNTER — Ambulatory Visit (INDEPENDENT_AMBULATORY_CARE_PROVIDER_SITE_OTHER): Payer: BLUE CROSS/BLUE SHIELD | Admitting: Nurse Practitioner

## 2016-11-15 ENCOUNTER — Encounter: Payer: Self-pay | Admitting: Nurse Practitioner

## 2016-11-15 ENCOUNTER — Telehealth: Payer: Self-pay | Admitting: Gastroenterology

## 2016-11-15 ENCOUNTER — Ambulatory Visit: Payer: BLUE CROSS/BLUE SHIELD | Admitting: Nurse Practitioner

## 2016-11-15 ENCOUNTER — Ambulatory Visit (INDEPENDENT_AMBULATORY_CARE_PROVIDER_SITE_OTHER): Payer: BLUE CROSS/BLUE SHIELD | Admitting: Gastroenterology

## 2016-11-15 ENCOUNTER — Other Ambulatory Visit: Payer: Self-pay

## 2016-11-15 ENCOUNTER — Encounter: Payer: Self-pay | Admitting: Gastroenterology

## 2016-11-15 VITALS — BP 142/92 | HR 84 | Temp 97.6°F | Ht 62.0 in | Wt 143.8 lb

## 2016-11-15 VITALS — BP 132/74 | Ht 62.0 in | Wt 145.0 lb

## 2016-11-15 DIAGNOSIS — N951 Menopausal and female climacteric states: Secondary | ICD-10-CM

## 2016-11-15 DIAGNOSIS — I10 Essential (primary) hypertension: Secondary | ICD-10-CM | POA: Diagnosis not present

## 2016-11-15 DIAGNOSIS — D508 Other iron deficiency anemias: Secondary | ICD-10-CM

## 2016-11-15 DIAGNOSIS — D509 Iron deficiency anemia, unspecified: Secondary | ICD-10-CM

## 2016-11-15 LAB — CBC WITH DIFFERENTIAL/PLATELET
BASOS PCT: 1 %
Basophils Absolute: 76 cells/uL (ref 0–200)
EOS PCT: 2 %
Eosinophils Absolute: 152 cells/uL (ref 15–500)
HCT: 34.9 % — ABNORMAL LOW (ref 35.0–45.0)
Hemoglobin: 10.2 g/dL — ABNORMAL LOW (ref 11.7–15.5)
Lymphocytes Relative: 29 %
Lymphs Abs: 2204 cells/uL (ref 850–3900)
MCH: 21.3 pg — ABNORMAL LOW (ref 27.0–33.0)
MCHC: 29.2 g/dL — ABNORMAL LOW (ref 32.0–36.0)
MCV: 73 fL — ABNORMAL LOW (ref 80.0–100.0)
MONOS PCT: 9 %
MPV: 9.9 fL (ref 7.5–12.5)
Monocytes Absolute: 684 cells/uL (ref 200–950)
Neutro Abs: 4484 cells/uL (ref 1500–7800)
Neutrophils Relative %: 59 %
Platelets: 443 10*3/uL — ABNORMAL HIGH (ref 140–400)
RBC: 4.78 MIL/uL (ref 3.80–5.10)
RDW: 29 % — ABNORMAL HIGH (ref 11.0–15.0)
WBC: 7.6 10*3/uL (ref 3.8–10.8)

## 2016-11-15 MED ORDER — PEG 3350-KCL-NA BICARB-NACL 420 G PO SOLR: 4000.0000 mL | ORAL | 0 refills | Status: DC

## 2016-11-15 NOTE — Assessment & Plan Note (Signed)
46 year old female with new-onset IDA, Hgb 7.6 recently and ferritin 5. No overt signs of GI bleeding. Stool is black on iron but doubt melena. Intermittent nausea noted. Vague lower, suprapubic discomfort noted recently. No changes in bowel habits. Ibuprofen occasionally. As of note, she states she is late for her menstrual cycle. Doubt she is pregnant, but I am ordering a urine pregnancy. Recheck CBC. If any worsening of anemia, may need 1 unit PRBCs before procedure as she is symptomatic.  Proceed with TCS/EGD with Dr. Gala Romney in near future: the risks, benefits, and alternatives have been discussed with the patient in detail. The patient states understanding and desires to proceed. Phenergan 25 mg IV on call  CBC and urine pregnancy today

## 2016-11-15 NOTE — Patient Instructions (Signed)
Please have blood work done at [facility], which is on the 2nd floor of the big building on [address], across from the Emergency Room department.  We have scheduled you for a colonoscopy and upper endoscopy with Dr. Gala Romney in the near future.

## 2016-11-15 NOTE — Progress Notes (Signed)
Primary Care Physician:  Kathyrn Drown, MD Primary Gastroenterologist:  Dr. Gala Romney   Chief Complaint  Patient presents with  . Anemia    dark stool-takes Iron, never had tcs  . Nausea    started last few days  . Abdominal Pain    lower abd    HPI:   Samantha Moreno is a 46 y.o. female presenting today at the request of her PCP secondary to new onset IDA. Labs in mid May with Hgb 7.6. Microcytic. Ferritin 5. Iron elevated at 276, TIBC 541. Started on oral iron.   Denies heavy menses. A week late for period. . Feels extremely fatigued. Has some shortness of breath just with walking up a flight of stairs, walking up to her office. No hematochezia. Dark stool started when on iron. Has had some nausea. Vomiting only once. Pain in lower abdomen, located suprapubically. Intermittent for the past few days. No constipation or diarrhea. No changes in bowel habits. Takes Ibuprofen occasionally. No dysphagia.   Past Medical History:  Diagnosis Date  . Gestational diabetes   . HTN (hypertension)     Past Surgical History:  Procedure Laterality Date  . exploratory laparoscopy  1992/1993   abdominal pain after second child     Current Outpatient Prescriptions  Medication Sig Dispense Refill  . ferrous sulfate 325 (65 FE) MG tablet Take 325 mg by mouth 2 (two) times daily.    Marland Kitchen ibuprofen (ADVIL,MOTRIN) 200 MG tablet Take 200 mg by mouth every 6 (six) hours as needed.    . metoprolol succinate (TOPROL-XL) 25 MG 24 hr tablet Take 1 tablet (25 mg total) by mouth daily. 30 tablet 2  . Vitamin D, Ergocalciferol, (DRISDOL) 50000 units CAPS capsule Take 1 capsule (50,000 Units total) by mouth every 7 (seven) days. 4 capsule 2   No current facility-administered medications for this visit.     Allergies as of 11/15/2016  . (No Known Allergies)    Family History  Problem Relation Age of Onset  . Stroke Mother   . Heart disease Mother   . Stroke Father   . Diabetes Father   .  Hypertension Father   . Prostate cancer Father   . Heart attack Sister   . Colon cancer Neg Hx   . Colon polyps Neg Hx     Social History   Social History  . Marital status: Single    Spouse name: N/A  . Number of children: N/A  . Years of education: N/A   Occupational History  . Geophysicist/field seismologist Service    Social History Main Topics  . Smoking status: Former Smoker    Quit date: 03/20/1991  . Smokeless tobacco: Never Used     Comment: smoke for 2 years around age 20  . Alcohol use No  . Drug use: No  . Sexual activity: Yes    Partners: Male    Birth control/ protection: None   Other Topics Concern  . Not on file   Social History Narrative  . No narrative on file    Review of Systems: As mentioned in HPI   Physical Exam: BP (!) 142/92   Pulse 84   Temp 97.6 F (36.4 C) (Oral)   Ht 5\' 2"  (1.575 m)   Wt 143 lb 12.8 oz (65.2 kg)   LMP 10/12/2016 (Exact Date)   BMI 26.30 kg/m  General:   Alert and oriented. Pleasant and cooperative. Pale  Head:  Normocephalic and atraumatic. Eyes:  Without icterus, sclera clear and conjunctiva pink.  Ears:  Normal auditory acuity. Nose:  No deformity, discharge,  or lesions. Mouth:  No deformity or lesions, oral mucosa pink.  Lungs:  Clear to auscultation bilaterally. No wheezes, rales, or rhonchi. No distress.  Heart:  S1, S2 present without murmurs appreciated.  Abdomen:  +BS, soft, mild suprapubic discomfort to palpation and non-distended. No HSM noted. No guarding or rebound. No masses appreciated.  Rectal:  Deferred  Msk:  Symmetrical without gross deformities. Normal posture. Extremities:  Without  edema. Neurologic:  Alert and  oriented x4 Psych:  Alert and cooperative. Normal mood and affect.  Lab Results  Component Value Date   WBC 8.4 10/25/2016   HGB 7.6 (L) 10/25/2016   HCT 27.5 (L) 10/25/2016   MCV 65 (L) 10/25/2016   PLT 539 (H) 10/25/2016   Lab Results  Component Value Date   IRON 276 (HH)  10/29/2016   TIBC 541 (H) 10/29/2016   FERRITIN 5 (L) 10/29/2016

## 2016-11-15 NOTE — Telephone Encounter (Signed)
Please have her hold iron starting Monday in preparation for procedure.

## 2016-11-16 LAB — PREGNANCY, URINE: Preg Test, Ur: NEGATIVE

## 2016-11-17 ENCOUNTER — Encounter: Payer: Self-pay | Admitting: Nurse Practitioner

## 2016-11-17 DIAGNOSIS — N951 Menopausal and female climacteric states: Secondary | ICD-10-CM | POA: Insufficient documentation

## 2016-11-17 NOTE — Progress Notes (Signed)
Subjective:  Presents for recheck on her BP. Compliant with meds. Taking vitamin D. Being followed by GI for severe anemia. Missed her menses this month. Has not used birth control for many years. Took pregnancy test at home which was negative. Cycles have been mainly regular with normal flow.   Objective:   BP 132/74   Ht 5\' 2"  (1.575 m)   Wt 145 lb (65.8 kg)   LMP 10/12/2016   BMI 26.52 kg/m  NAD. Alert, oriented. Lungs clear. Heart RRR. BP on recheck left arm sitting 144/96. LE: no edema.   Assessment:   Problem List Items Addressed This Visit      Cardiovascular and Mediastinum   Essential hypertension - Primary     Other   Perimenopause       Plan:  Continue current BP pill for now. Monitor BP outside of office. Discussed perimenopause. Recommend labs for hormones in 3 months if no cycle. Patient to call back at that time. If not menopausal, recommend at a minimum having a cycle every 3 months.  Return in about 6 months (around 05/17/2017) for BP recheck.

## 2016-11-18 ENCOUNTER — Encounter: Payer: Self-pay | Admitting: Gastroenterology

## 2016-11-18 ENCOUNTER — Encounter: Payer: Self-pay | Admitting: Nurse Practitioner

## 2016-11-18 NOTE — Progress Notes (Signed)
cc'ed to pcp °

## 2016-11-18 NOTE — Telephone Encounter (Signed)
Tried to call with no answer  

## 2016-11-18 NOTE — Telephone Encounter (Signed)
Pt is aware.  

## 2016-11-19 ENCOUNTER — Telehealth: Payer: Self-pay | Admitting: Gastroenterology

## 2016-11-22 ENCOUNTER — Encounter (HOSPITAL_COMMUNITY)
Admission: RE | Disposition: A | Discharge status: Home / Self Care | Payer: Self-pay | Source: Ambulatory Visit | Attending: Internal Medicine

## 2016-11-22 ENCOUNTER — Encounter (HOSPITAL_COMMUNITY): Payer: Self-pay | Admitting: *Deleted

## 2016-11-22 ENCOUNTER — Ambulatory Visit (HOSPITAL_COMMUNITY): Payer: Self-pay

## 2016-11-22 ENCOUNTER — Ambulatory Visit (HOSPITAL_COMMUNITY)
Admission: RE | Admit: 2016-11-22 | Discharge: 2016-11-22 | Disposition: A | Discharge status: Home / Self Care | Payer: BLUE CROSS/BLUE SHIELD | Source: Ambulatory Visit | Attending: Internal Medicine | Admitting: Internal Medicine

## 2016-11-22 ENCOUNTER — Ambulatory Visit: Payer: BLUE CROSS/BLUE SHIELD | Admitting: Nurse Practitioner

## 2016-11-22 DIAGNOSIS — Z833 Family history of diabetes mellitus: Secondary | ICD-10-CM | POA: Insufficient documentation

## 2016-11-22 DIAGNOSIS — K2950 Unspecified chronic gastritis without bleeding: Secondary | ICD-10-CM | POA: Diagnosis not present

## 2016-11-22 DIAGNOSIS — D509 Iron deficiency anemia, unspecified: Secondary | ICD-10-CM | POA: Diagnosis present

## 2016-11-22 DIAGNOSIS — K5730 Diverticulosis of large intestine without perforation or abscess without bleeding: Secondary | ICD-10-CM | POA: Diagnosis not present

## 2016-11-22 DIAGNOSIS — I10 Essential (primary) hypertension: Secondary | ICD-10-CM | POA: Diagnosis not present

## 2016-11-22 DIAGNOSIS — B9681 Helicobacter pylori [H. pylori] as the cause of diseases classified elsewhere: Secondary | ICD-10-CM | POA: Insufficient documentation

## 2016-11-22 DIAGNOSIS — R0602 Shortness of breath: Secondary | ICD-10-CM | POA: Insufficient documentation

## 2016-11-22 DIAGNOSIS — Z87891 Personal history of nicotine dependence: Secondary | ICD-10-CM | POA: Insufficient documentation

## 2016-11-22 DIAGNOSIS — Z823 Family history of stroke: Secondary | ICD-10-CM | POA: Insufficient documentation

## 2016-11-22 DIAGNOSIS — Z8249 Family history of ischemic heart disease and other diseases of the circulatory system: Secondary | ICD-10-CM | POA: Insufficient documentation

## 2016-11-22 DIAGNOSIS — Z8042 Family history of malignant neoplasm of prostate: Secondary | ICD-10-CM | POA: Insufficient documentation

## 2016-11-22 DIAGNOSIS — K3189 Other diseases of stomach and duodenum: Secondary | ICD-10-CM

## 2016-11-22 HISTORY — DX: Headache, unspecified: R51.9

## 2016-11-22 HISTORY — DX: Headache: R51

## 2016-11-22 HISTORY — PX: COLONOSCOPY: SHX5424

## 2016-11-22 HISTORY — PX: ESOPHAGOGASTRODUODENOSCOPY: SHX5428

## 2016-11-22 SURGERY — COLONOSCOPY
Anesthesia: Moderate Sedation

## 2016-11-22 MED ORDER — SODIUM CHLORIDE 0.9 % IV SOLN
INTRAVENOUS | Status: DC
  Administered 2016-11-22: 1000 mL via INTRAVENOUS

## 2016-11-22 MED ORDER — SODIUM CHLORIDE 0.9% FLUSH
INTRAVENOUS | Status: AC
  Filled 2016-11-22: qty 10

## 2016-11-22 MED ORDER — MEPERIDINE HCL 100 MG/ML IJ SOLN
INTRAMUSCULAR | Status: DC | PRN
  Administered 2016-11-22: 50 mg via INTRAVENOUS
  Administered 2016-11-22 (×3): 25 mg via INTRAVENOUS

## 2016-11-22 MED ORDER — LIDOCAINE VISCOUS 2 % MT SOLN
OROMUCOSAL | Status: AC
  Filled 2016-11-22: qty 15

## 2016-11-22 MED ORDER — MIDAZOLAM HCL 5 MG/5ML IJ SOLN
INTRAMUSCULAR | Status: AC
  Filled 2016-11-22: qty 10

## 2016-11-22 MED ORDER — PROMETHAZINE HCL 25 MG/ML IJ SOLN
25.0000 mg | Freq: Once | INTRAMUSCULAR | Status: AC
  Administered 2016-11-22: 25 mg via INTRAVENOUS

## 2016-11-22 MED ORDER — LIDOCAINE VISCOUS 2 % MT SOLN
OROMUCOSAL | Status: DC | PRN
  Administered 2016-11-22: 1 via OROMUCOSAL

## 2016-11-22 MED ORDER — MEPERIDINE HCL 100 MG/ML IJ SOLN
INTRAMUSCULAR | Status: AC
  Filled 2016-11-22: qty 2

## 2016-11-22 MED ORDER — ONDANSETRON HCL 4 MG/2ML IJ SOLN
INTRAMUSCULAR | Status: AC
  Filled 2016-11-22: qty 2

## 2016-11-22 MED ORDER — MIDAZOLAM HCL 5 MG/5ML IJ SOLN
INTRAMUSCULAR | Status: DC | PRN
  Administered 2016-11-22: 2 mg via INTRAVENOUS
  Administered 2016-11-22: 1 mg via INTRAVENOUS
  Administered 2016-11-22: 2 mg via INTRAVENOUS
  Administered 2016-11-22: 1 mg via INTRAVENOUS

## 2016-11-22 MED ORDER — PROMETHAZINE HCL 25 MG/ML IJ SOLN
INTRAMUSCULAR | Status: AC
  Filled 2016-11-22: qty 1

## 2016-11-22 NOTE — Op Note (Signed)
[facility] Patient Name: Samantha Moreno Procedure Date: 11/22/2016 2:31 PM MRN: [id number] Date of Birth: 01-Apr-1971 Attending MD: Norvel Richards , MD CSN: [id number] Age: 46 Admit Type: Outpatient Procedure:                Upper GI endoscopy Indications:              Iron deficiency anemia Providers:                Norvel Richards, MD, Lurline Del, RN, Purcell Nails.                            [place], Merchant [employer] officer Referring MD:              Medicines:                Midazolam 5 mg IV, Meperidine 100 mg IV,                            Ondansetron 4 mg IV, Promethazine 25 mg IV Complications:            No immediate complications. Estimated Blood Loss:     Estimated blood loss was minimal. Procedure:                Pre-Anesthesia Assessment:                           - Prior to the procedure, a History and Physical                            was performed, and patient medications and                            allergies were reviewed. The patient's tolerance of                            previous anesthesia was also reviewed. The risks                            and benefits of the procedure and the sedation                            options and risks were discussed with the patient.                            All questions were answered, and informed consent                            was obtained. Prior Anticoagulants: The patient has                            taken no previous anticoagulant or antiplatelet                            agents. ASA Grade Assessment: II - A patient with  mild systemic disease. After reviewing the risks                            and benefits, the patient was deemed in                            satisfactory condition to undergo the procedure.                           After obtaining informed consent, the endoscope was                            passed under direct vision. Throughout the   procedure, the patient's blood pressure, pulse, and                            oxygen saturations were monitored continuously. The                            [id number] ([id number]) scope was introduced through the                            mouth, and advanced to the second part of duodenum.                            The upper GI endoscopy was accomplished without                            difficulty. The patient tolerated the procedure                            well. Scope In: 2:39:46 PM Scope Out: 2:44:34 PM Total Procedure Duration: 0 hours 4 minutes 48 seconds  Findings:      The examined esophagus was normal.      Diffuse moderate mucosal changes were found in the stomach. Some "snake       skinning" of the gastric mucosa diffusely. No ulcer or infiltrating       process seen. This was biopsied with a cold forceps for histology.       Estimated blood loss was minimal.      The duodenal bulb and second portion of the duodenum were normal. Impression:               - Normal esophagus.                           - Abnormal appearing gastric mucosa but uncertain                            significance?"rule out H. pylori                           - Normal duodenal bulb and second portion of the                            duodenum. Moderate Sedation:  Moderate (conscious) sedation was administered by the endoscopy nurse       and supervised by the endoscopist. The following parameters were       monitored: oxygen saturation, heart rate, blood pressure, respiratory       rate, EKG, adequacy of pulmonary ventilation, and response to care.       Total physician intraservice time was 15 minutes. Recommendation:           - Patient has a contact number available for                            emergencies. The signs and symptoms of potential                            delayed complications were discussed with the                            patient. Return to normal activities tomorrow.                             Written discharge instructions were provided to the                            patient.                           - Resume previous diet.                           - Continue present medications.                           - Await pathology results.                           - No repeat upper endoscopy.                           - Return to GI office (date not yet determined).                            See colonoscopy report. Procedure Code(s):        --- Professional ---                           [phone], Esophagogastroduodenoscopy, flexible,                            transoral; with biopsy, single or multiple                           99152, Moderate sedation services provided by the                            same physician or other qualified health care  professional performing the diagnostic or                            therapeutic service that the sedation supports,                            requiring the presence of an independent trained                            observer to assist in the monitoring of the                            patient's level of consciousness and physiological                            status; initial 15 minutes of intraservice time,                            patient age 58 years or older Diagnosis Code(s):        --- Professional ---                           K31.89, Other diseases of stomach and duodenum                           D50.9, Iron deficiency anemia, unspecified CPT copyright 2016 American Medical Association. All rights reserved. The codes documented in this report are preliminary and upon coder review may  be revised to meet current compliance requirements. Cristopher Estimable. Meldon Hanzlik, MD Norvel Richards, MD 11/22/2016 3:06:33 PM This report has been signed electronically. Number of Addenda: 0

## 2016-11-22 NOTE — Op Note (Signed)
[facility] Patient Name: Samantha Moreno Procedure Date: 11/22/2016 2:48 PM MRN: [id number] Date of Birth: [birth year] Attending MD: Norvel Richards , MD CSN: [id number] Age: 46 Admit Type: Outpatient Procedure:                Colonoscopy Indications:              Iron deficiency anemia Providers:                Norvel Richards, MD, Lurline Del, RN, [facility], Technician Referring MD:              Medicines:                Midazolam 6 mg IV, Meperidine 125 mg IV,                            Ondansetron 4 mg IV, Promethazine 25 mg IV Complications:            No immediate complications. Estimated Blood Loss:     Estimated blood loss was minimal. Procedure:                Pre-Anesthesia Assessment:                           - Prior to the procedure, a History and Physical                            was performed, and patient medications and                            allergies were reviewed. The patient's tolerance of                            previous anesthesia was also reviewed. The risks                            and benefits of the procedure and the sedation                            options and risks were discussed with the patient.                            All questions were answered, and informed consent                            was obtained. Prior Anticoagulants: The patient has                            taken no previous anticoagulant or antiplatelet                            agents. ASA Grade Assessment: II - A patient with  mild systemic disease. After reviewing the risks                            and benefits, the patient was deemed in                            satisfactory condition to undergo the procedure.                           After obtaining informed consent, the colonoscope                            was passed under direct vision. Throughout the                            procedure,  the patient's blood pressure, pulse, and                            oxygen saturations were monitored continuously. The                            Colonoscope was introduced through the anus and                            advanced to the 5 cm into the ileum. The                            colonoscopy was performed without difficulty. The                            patient tolerated the procedure well. The quality                            of the bowel preparation was adequate. The entire                            colon was well visualized. The terminal ileum,                            ileocecal valve, appendiceal orifice, and rectum                            were photographed. Scope In: 2:52:41 PM Scope Out: 3:02:04 PM Scope Withdrawal Time: 0 hours 6 minutes 48 seconds  Total Procedure Duration: 0 hours 9 minutes 23 seconds  Findings:      The perianal and digital rectal examinations were normal.      Scattered small-mouthed diverticula were found in the sigmoid colon and       descending colon.      The exam was otherwise without abnormality. Distal 5 cm of terminal       ileal mucosa also appeared normal. Impression:               - Diverticulosis in the sigmoid colon and in the  descending colon.                           - The examination was otherwise normal.                           - No specimens collected. Moderate Sedation:      Moderate (conscious) sedation was administered by the endoscopy nurse       and supervised by the endoscopist. The following parameters were       monitored: oxygen saturation, heart rate, blood pressure, respiratory       rate, EKG, adequacy of pulmonary ventilation, and response to care.       Total physician intraservice time was 33 minutes. Recommendation:           - Patient has a contact number available for                            emergencies. The signs and symptoms of potential                             delayed complications were discussed with the                            patient. Return to normal activities tomorrow.                            Written discharge instructions were provided to the                            patient.                           - Resume previous diet.                           - Continue present medications.                           - Await pathology results.                           - Repeat colonoscopy in 10 years for surveillance.                           - Return to GI clinic (date not yet determined).                            See EGD report. Procedure Code(s):        --- Professional ---                           [phone], Colonoscopy, flexible; diagnostic, including                            collection of specimen(s) by brushing or washing,  when performed (separate procedure)                           99152, Moderate sedation services provided by the                            same physician or other qualified health care                            professional performing the diagnostic or                            therapeutic service that the sedation supports,                            requiring the presence of an independent trained                            observer to assist in the monitoring of the                            patient's level of consciousness and physiological                            status; initial 15 minutes of intraservice time,                            patient age 45 years or older                           [phone], Moderate sedation services; each additional                            15 minutes intraservice time Diagnosis Code(s):        --- Professional ---                           D50.9, Iron deficiency anemia, unspecified                           K57.30, Diverticulosis of large intestine without                            perforation or abscess without bleeding CPT copyright 2016  American Medical Association. All rights reserved. The codes documented in this report are preliminary and upon coder review may  be revised to meet current compliance requirements. Cristopher Estimable. Jonetta Dagley, MD Norvel Richards, MD 11/22/2016 3:09:39 PM This report has been signed electronically. Number of Addenda: 0

## 2016-11-22 NOTE — H&P (View-Only) (Signed)
Primary Care Physician:  Kathyrn Drown, MD Primary Gastroenterologist:  Dr. Gala Romney   Chief Complaint  Patient presents with  . Anemia    dark stool-takes Iron, never had tcs  . Nausea    started last few days  . Abdominal Pain    lower abd    HPI:   Samantha Moreno is a 46 y.o. female presenting today at the request of her PCP secondary to new onset IDA. Labs in mid May with Hgb 7.6. Microcytic. Ferritin 5. Iron elevated at 276, TIBC 541. Started on oral iron.   Denies heavy menses. A week late for period. . Feels extremely fatigued. Has some shortness of breath just with walking up a flight of stairs, walking up to her office. No hematochezia. Dark stool started when on iron. Has had some nausea. Vomiting only once. Pain in lower abdomen, located suprapubically. Intermittent for the past few days. No constipation or diarrhea. No changes in bowel habits. Takes Ibuprofen occasionally. No dysphagia.   Past Medical History:  Diagnosis Date  . Gestational diabetes   . HTN (hypertension)     Past Surgical History:  Procedure Laterality Date  . exploratory laparoscopy  1992/1993   abdominal pain after second child     Current Outpatient Prescriptions  Medication Sig Dispense Refill  . ferrous sulfate 325 (65 FE) MG tablet Take 325 mg by mouth 2 (two) times daily.    Marland Kitchen ibuprofen (ADVIL,MOTRIN) 200 MG tablet Take 200 mg by mouth every 6 (six) hours as needed.    . metoprolol succinate (TOPROL-XL) 25 MG 24 hr tablet Take 1 tablet (25 mg total) by mouth daily. 30 tablet 2  . Vitamin D, Ergocalciferol, (DRISDOL) 50000 units CAPS capsule Take 1 capsule (50,000 Units total) by mouth every 7 (seven) days. 4 capsule 2   No current facility-administered medications for this visit.     Allergies as of 11/15/2016  . (No Known Allergies)    Family History  Problem Relation Age of Onset  . Stroke Mother   . Heart disease Mother   . Stroke Father   . Diabetes Father   .  Hypertension Father   . Prostate cancer Father   . Heart attack Sister   . Colon cancer Neg Hx   . Colon polyps Neg Hx     Social History   Social History  . Marital status: Single    Spouse name: N/A  . Number of children: N/A  . Years of education: N/A   Occupational History  . Geophysicist/field seismologist Service    Social History Main Topics  . Smoking status: Former Smoker    Quit date: 03/20/1991  . Smokeless tobacco: Never Used     Comment: smoke for 2 years around age 64  . Alcohol use No  . Drug use: No  . Sexual activity: Yes    Partners: Male    Birth control/ protection: None   Other Topics Concern  . Not on file   Social History Narrative  . No narrative on file    Review of Systems: As mentioned in HPI   Physical Exam: BP (!) 142/92   Pulse 84   Temp 97.6 F (36.4 C) (Oral)   Ht 5\' 2"  (1.575 m)   Wt 143 lb 12.8 oz (65.2 kg)   LMP 10/12/2016 (Exact Date)   BMI 26.30 kg/m  General:   Alert and oriented. Pleasant and cooperative. Pale  Head:  Normocephalic and atraumatic. Eyes:  Without icterus, sclera clear and conjunctiva pink.  Ears:  Normal auditory acuity. Nose:  No deformity, discharge,  or lesions. Mouth:  No deformity or lesions, oral mucosa pink.  Lungs:  Clear to auscultation bilaterally. No wheezes, rales, or rhonchi. No distress.  Heart:  S1, S2 present without murmurs appreciated.  Abdomen:  +BS, soft, mild suprapubic discomfort to palpation and non-distended. No HSM noted. No guarding or rebound. No masses appreciated.  Rectal:  Deferred  Msk:  Symmetrical without gross deformities. Normal posture. Extremities:  Without  edema. Neurologic:  Alert and  oriented x4 Psych:  Alert and cooperative. Normal mood and affect.  Lab Results  Component Value Date   WBC 8.4 10/25/2016   HGB 7.6 (L) 10/25/2016   HCT 27.5 (L) 10/25/2016   MCV 65 (L) 10/25/2016   PLT 539 (H) 10/25/2016   Lab Results  Component Value Date   IRON 276 (HH)  10/29/2016   TIBC 541 (H) 10/29/2016   FERRITIN 5 (L) 10/29/2016

## 2016-11-22 NOTE — Discharge Instructions (Addendum)
EGD Discharge instructions Please read the instructions outlined below and refer to this sheet in the next few weeks. These discharge instructions provide you with general information on caring for yourself after you leave the hospital. Your doctor may also give you specific instructions. While your treatment has been planned according to the most current medical practices available, unavoidable complications occasionally occur. If you have any problems or questions after discharge, please call your doctor. ACTIVITY  You may resume your regular activity but move at a slower pace for the next 24 hours.   Take frequent rest periods for the next 24 hours.   Walking will help expel (get rid of) the air and reduce the bloated feeling in your abdomen.   No driving for 24 hours (because of the anesthesia (medicine) used during the test).   You may shower.   Do not sign any important legal documents or operate any machinery for 24 hours (because of the anesthesia used during the test).  NUTRITION  Drink plenty of fluids.   You may resume your normal diet.   Begin with a light meal and progress to your normal diet.   Avoid alcoholic beverages for 24 hours or as instructed by your caregiver.  MEDICATIONS  You may resume your normal medications unless your caregiver tells you otherwise.  WHAT YOU CAN EXPECT TODAY  You may experience abdominal discomfort such as a feeling of fullness or gas pains.  FOLLOW-UP  Your doctor will discuss the results of your test with you.  SEEK IMMEDIATE MEDICAL ATTENTION IF ANY OF THE FOLLOWING OCCUR:  Excessive nausea (feeling sick to your stomach) and/or vomiting.   Severe abdominal pain and distention (swelling).   Trouble swallowing.   Temperature over 101 F (37.8 C).   Rectal bleeding or vomiting of blood.     Colonoscopy Discharge Instructions  Read the instructions outlined below and refer to this sheet in the next few weeks. These  discharge instructions provide you with general information on caring for yourself after you leave the hospital. Your doctor may also give you specific instructions. While your treatment has been planned according to the most current medical practices available, unavoidable complications occasionally occur. If you have any problems or questions after discharge, call Dr. Gala Romney at [phone]. ACTIVITY  You may resume your regular activity, but move at a slower pace for the next 24 hours.   Take frequent rest periods for the next 24 hours.   Walking will help get rid of the air and reduce the bloated feeling in your belly (abdomen).   No driving for 24 hours (because of the medicine (anesthesia) used during the test).    Do not sign any important legal documents or operate any machinery for 24 hours (because of the anesthesia used during the test).  NUTRITION  Drink plenty of fluids.   You may resume your normal diet as instructed by your doctor.   Begin with a light meal and progress to your normal diet. Heavy or fried foods are harder to digest and may make you feel sick to your stomach (nauseated).   Avoid alcoholic beverages for 24 hours or as instructed.  MEDICATIONS  You may resume your normal medications unless your doctor tells you otherwise.  WHAT YOU CAN EXPECT TODAY  Some feelings of bloating in the abdomen.   Passage of more gas than usual.   Spotting of blood in your stool or on the toilet paper.  IF YOU HAD POLYPS REMOVED DURING  THE COLONOSCOPY:  No aspirin products for 7 days or as instructed.   No alcohol for 7 days or as instructed.   Eat a soft diet for the next 24 hours.  FINDING OUT THE RESULTS OF YOUR TEST Not all test results are available during your visit. If your test results are not back during the visit, make an appointment with your caregiver to find out the results. Do not assume everything is normal if you have not heard from your caregiver or the  medical facility. It is important for you to follow up on all of your test results.  SEEK IMMEDIATE MEDICAL ATTENTION IF:  You have more than a spotting of blood in your stool.   Your belly is swollen (abdominal distention).   You are nauseated or vomiting.   You have a temperature over 101.   You have abdominal pain or discomfort that is severe or gets worse throughout the day.    Stop iron supplement until after the capsule study next week  Colon diverticulosis information provided  Plan for small bowel capsule study on June 19.  My office will call with further instructions first thing on Monday, June 18  You should decrease the use of ibuprofen significantly. No more than 2 daily.  Further recommendations to follow pending review of pathology report    Diverticulosis Diverticulosis is a condition that develops when small pouches (diverticula) form in the wall of the large intestine (colon). The colon is where water is absorbed and stool is formed. The pouches form when the inside layer of the colon pushes through weak spots in the outer layers of the colon. You may have a few pouches or many of them. What are the causes? The cause of this condition is not known. What increases the risk? The following factors may make you more likely to develop this condition:  Being older than age 65. Your risk for this condition increases with age. Diverticulosis is rare among people younger than age 50. By age 52, many people have it.  Eating a low-fiber diet.  Having frequent constipation.  Being overweight.  Not getting enough exercise.  Smoking.  Taking over-the-counter pain medicines, like aspirin and ibuprofen.  Having a family history of diverticulosis.  What are the signs or symptoms? In most people, there are no symptoms of this condition. If you do have symptoms, they may include:  Bloating.  Cramps in the abdomen.  Constipation or diarrhea.  Pain in the  lower left side of the abdomen.  How is this diagnosed? This condition is most often diagnosed during an exam for other colon problems. Because diverticulosis usually has no symptoms, it often cannot be diagnosed independently. This condition may be diagnosed by:  Using a flexible scope to examine the colon (colonoscopy).  Taking an X-ray of the colon after dye has been put into the colon (barium enema).  Doing a CT scan.  How is this treated? You may not need treatment for this condition if you have never developed an infection related to diverticulosis. If you have had an infection before, treatment may include:  Eating a high-fiber diet. This may include eating more fruits, vegetables, and grains.  Taking a fiber supplement.  Taking a live bacteria supplement (probiotic).  Taking medicine to relax your colon.  Taking antibiotic medicines.  Follow these instructions at home:  Drink 6-8 glasses of water or more each day to prevent constipation.  Try not to strain when you have a bowel  movement.  If you have had an infection before: ? Eat more fiber as directed by your health care provider or your diet and nutrition specialist (dietitian). ? Take a fiber supplement or probiotic, if your health care provider approves.  Take over-the-counter and prescription medicines only as told by your health care provider.  If you were prescribed an antibiotic, take it as told by your health care provider. Do not stop taking the antibiotic even if you start to feel better.  Keep all follow-up visits as told by your health care provider. This is important. Contact a health care provider if:  You have pain in your abdomen.  You have bloating.  You have cramps.  You have not had a bowel movement in 3 days. Get help right away if:  Your pain gets worse.  Your bloating becomes very bad.  You have a fever or chills, and your symptoms suddenly get worse.  You vomit.  You have  bowel movements that are bloody or black.  You have bleeding from your rectum. Summary  Diverticulosis is a condition that develops when small pouches (diverticula) form in the wall of the large intestine (colon).  You may have a few pouches or many of them.  This condition is most often diagnosed during an exam for other colon problems.  If you have had an infection related to diverticulosis, treatment may include increasing the fiber in your diet, taking supplements, or taking medicines. This information is not intended to replace advice given to you by your health care provider. Make sure you discuss any questions you have with your health care provider. Document Released: 02/22/2004 Document Revised: 04/15/2016 Document Reviewed: 04/15/2016 Elsevier Interactive Patient Education  2017 [employer].

## 2016-11-22 NOTE — Interval H&P Note (Signed)
History and Physical Interval Note:  11/22/2016 2:25 PM  Samantha Moreno  has presented today for surgery, with the diagnosis of IDA  The various methods of treatment have been discussed with the patient and family. After consideration of risks, benefits and other options for treatment, the patient has consented to  Procedure(s) with comments: COLONOSCOPY (N/A) - 245 ESOPHAGOGASTRODUODENOSCOPY (EGD) (N/A) as a surgical intervention .  The patient's history has been reviewed, patient examined, no change in status, stable for surgery.  I have reviewed the patient's chart and labs.  Questions were answered to the patient's satisfaction.     Sasha Rogel  No change. Patient denies dysphagia. EGD and colonoscopy per plan. The risks, benefits, limitations, imponderables and alternatives regarding both EGD and colonoscopy have been reviewed with the patient. Questions have been answered. All parties agreeable.

## 2016-11-25 ENCOUNTER — Other Ambulatory Visit: Payer: Self-pay

## 2016-11-25 ENCOUNTER — Telehealth: Payer: Self-pay | Admitting: Gastroenterology

## 2016-11-25 ENCOUNTER — Telehealth: Payer: Self-pay

## 2016-11-25 DIAGNOSIS — D509 Iron deficiency anemia, unspecified: Secondary | ICD-10-CM

## 2016-11-25 NOTE — Telephone Encounter (Signed)
Pt is aware to start clear liquids at 12:00 pm today and nothing after 8:00 pm. She is also aware to be at [facility] in the morning at 7:00 am.

## 2016-11-25 NOTE — Telephone Encounter (Signed)
-----   Message from Daneil Dolin, MD sent at 11/22/2016  3:21 PM EDT ----- Need  capsule study of  small bowel June 19. Iron stopped today.  Please call her first thing Monday morning with instructions.

## 2016-11-25 NOTE — Telephone Encounter (Signed)
Patient would benefit from dose of Feraheme. Can we set this up? Then recheck CBC, iron, ferritin, TIBC in 6 weeks.

## 2016-11-25 NOTE — Telephone Encounter (Signed)
Orders given to Vicente Males to sign. I have not put the future labs in yet.

## 2016-11-26 ENCOUNTER — Ambulatory Visit (HOSPITAL_COMMUNITY)
Admission: RE | Admit: 2016-11-26 | Discharge: 2016-11-26 | Disposition: A | Discharge status: Home / Self Care | Payer: BLUE CROSS/BLUE SHIELD | Source: Ambulatory Visit | Attending: Internal Medicine | Admitting: Internal Medicine

## 2016-11-26 ENCOUNTER — Encounter: Payer: Self-pay | Admitting: Internal Medicine

## 2016-11-26 ENCOUNTER — Encounter (HOSPITAL_COMMUNITY): Payer: Self-pay | Admitting: *Deleted

## 2016-11-26 ENCOUNTER — Encounter (HOSPITAL_COMMUNITY)
Admission: RE | Disposition: A | Discharge status: Home / Self Care | Payer: Self-pay | Source: Ambulatory Visit | Attending: Internal Medicine

## 2016-11-26 DIAGNOSIS — D509 Iron deficiency anemia, unspecified: Secondary | ICD-10-CM | POA: Diagnosis not present

## 2016-11-26 HISTORY — PX: GIVENS CAPSULE STUDY: SHX5432

## 2016-11-26 SURGERY — IMAGING PROCEDURE, GI TRACT, INTRALUMINAL, VIA CAPSULE

## 2016-11-26 NOTE — Telephone Encounter (Signed)
Orders have been faxed to short stay.

## 2016-11-26 NOTE — Telephone Encounter (Signed)
Opened in error

## 2016-11-26 NOTE — Patient Instructions (Signed)
[place] for GIVENS  [phone]

## 2016-11-26 NOTE — Telephone Encounter (Signed)
[place] for GIVENS  [phone]

## 2016-11-26 NOTE — Telephone Encounter (Signed)
Orders complete.

## 2016-11-27 ENCOUNTER — Telehealth: Payer: Self-pay

## 2016-11-27 ENCOUNTER — Encounter (HOSPITAL_COMMUNITY): Payer: Self-pay | Admitting: Internal Medicine

## 2016-11-27 ENCOUNTER — Ambulatory Visit (HOSPITAL_COMMUNITY)
Admission: RE | Admit: 2016-11-27 | Discharge: 2016-11-27 | Disposition: A | Discharge status: Home / Self Care | Payer: BLUE CROSS/BLUE SHIELD | Source: Ambulatory Visit | Attending: Nurse Practitioner | Admitting: Nurse Practitioner

## 2016-11-27 ENCOUNTER — Telehealth (INDEPENDENT_AMBULATORY_CARE_PROVIDER_SITE_OTHER): Payer: Self-pay | Admitting: Internal Medicine

## 2016-11-27 DIAGNOSIS — Z1231 Encounter for screening mammogram for malignant neoplasm of breast: Secondary | ICD-10-CM | POA: Diagnosis not present

## 2016-11-27 MED ORDER — AMOXICILL-CLARITHRO-LANSOPRAZ PO MISC: Freq: Two times a day (BID) | ORAL | 0 refills | Status: DC

## 2016-11-27 NOTE — Telephone Encounter (Signed)
Letter and hpylori information mailed to the pt.  Message sent to her in [place].

## 2016-11-27 NOTE — Telephone Encounter (Signed)
rx has been sent to [facility]. Pt is aware.

## 2016-11-27 NOTE — Telephone Encounter (Signed)
Per RMR-  Rourk, Cristopher Estimable, MD  Claudina Lick, LPN; Theadora Rama        Send letter to patient.  Send copy of letter with path to referring provider and PCP.  Patient needs PrevPak or generic equivalent x 14 days--hold any acid suppression and/or statin therapy patient may be taking during treatment - then resume

## 2016-11-27 NOTE — Telephone Encounter (Signed)
Patient called earlier today if abdominal pain and diarrhea. She told me she had EGD and colonoscopy by Dr. Vincenza Hews last Friday and she had given capsule yesterday. She stated her temp was 99. She was not having nausea vomiting hematemesis melena or rectal bleeding. I recommended Tylenol OTC or ER visit. He decided to see how she does for another hour or so before she makes final decision. If she does not come to emergency room will arrange for to be seen at [facility] tomorrow.

## 2016-11-28 ENCOUNTER — Encounter: Payer: Self-pay | Admitting: Gastroenterology

## 2016-11-28 ENCOUNTER — Other Ambulatory Visit (HOSPITAL_COMMUNITY)
Admission: RE | Admit: 2016-11-28 | Discharge: 2016-11-28 | Disposition: A | Discharge status: Home / Self Care | Payer: BLUE CROSS/BLUE SHIELD | Source: Ambulatory Visit | Attending: Gastroenterology | Admitting: Gastroenterology

## 2016-11-28 ENCOUNTER — Ambulatory Visit (INDEPENDENT_AMBULATORY_CARE_PROVIDER_SITE_OTHER): Payer: BLUE CROSS/BLUE SHIELD | Admitting: Gastroenterology

## 2016-11-28 ENCOUNTER — Telehealth: Payer: Self-pay

## 2016-11-28 ENCOUNTER — Ambulatory Visit (HOSPITAL_COMMUNITY)
Admission: RE | Admit: 2016-11-28 | Discharge: 2016-11-28 | Disposition: A | Discharge status: Home / Self Care | Payer: BLUE CROSS/BLUE SHIELD | Source: Ambulatory Visit | Attending: Gastroenterology | Admitting: Gastroenterology

## 2016-11-28 ENCOUNTER — Encounter (HOSPITAL_COMMUNITY): Payer: Self-pay

## 2016-11-28 VITALS — BP 137/93 | HR 88 | Temp 97.1°F | Ht 62.0 in | Wt 141.2 lb

## 2016-11-28 DIAGNOSIS — R1032 Left lower quadrant pain: Secondary | ICD-10-CM | POA: Diagnosis not present

## 2016-11-28 DIAGNOSIS — R197 Diarrhea, unspecified: Secondary | ICD-10-CM

## 2016-11-28 DIAGNOSIS — R112 Nausea with vomiting, unspecified: Secondary | ICD-10-CM | POA: Insufficient documentation

## 2016-11-28 DIAGNOSIS — K760 Fatty (change of) liver, not elsewhere classified: Secondary | ICD-10-CM | POA: Insufficient documentation

## 2016-11-28 LAB — CBC WITH DIFFERENTIAL/PLATELET
Basophils Absolute: 0.1 10*3/uL (ref 0.0–0.1)
Basophils Relative: 1 %
EOS ABS: 0.3 10*3/uL (ref 0.0–0.7)
EOS PCT: 2 %
HEMATOCRIT: 36.4 % (ref 36.0–46.0)
Hemoglobin: 11.2 g/dL — ABNORMAL LOW (ref 12.0–15.0)
LYMPHS ABS: 3 10*3/uL (ref 0.7–4.0)
LYMPHS PCT: 27 %
MCH: 23.4 pg — AB (ref 26.0–34.0)
MCHC: 30.8 g/dL (ref 30.0–36.0)
MCV: 76.2 fL — AB (ref 78.0–100.0)
Monocytes Absolute: 1 10*3/uL (ref 0.1–1.0)
Monocytes Relative: 9 %
NEUTROS PCT: 61 %
Neutro Abs: 6.7 10*3/uL (ref 1.7–7.7)
PLATELETS: 391 10*3/uL (ref 150–400)
RBC: 4.78 MIL/uL (ref 3.87–5.11)
WBC: 11.1 10*3/uL — ABNORMAL HIGH (ref 4.0–10.5)

## 2016-11-28 LAB — C DIFFICILE QUICK SCREEN W PCR REFLEX
C Diff antigen: NEGATIVE
C Diff interpretation: NOT DETECTED
C Diff toxin: NEGATIVE

## 2016-11-28 LAB — CREATININE, SERUM
Creatinine, Ser: 0.59 mg/dL (ref 0.44–1.00)
GFR calc non Af Amer: >60 mL/min (ref >60)

## 2016-11-28 LAB — HCG, SERUM, QUALITATIVE: PREG SERUM: NEGATIVE

## 2016-11-28 MED ORDER — IOPAMIDOL (ISOVUE-300) INJECTION 61%
INTRAVENOUS | Status: AC
  Filled 2016-11-28: qty 50

## 2016-11-28 MED ORDER — HYDROCODONE-ACETAMINOPHEN 5-325 MG PO TABS: 1.0000 | ORAL_TABLET | ORAL | 0 refills | Status: DC | PRN

## 2016-11-28 MED ORDER — IOPAMIDOL (ISOVUE-300) INJECTION 61%
100.0000 mL | Freq: Once | INTRAVENOUS | Status: AC | PRN
  Administered 2016-11-28: 100 mL via INTRAVENOUS

## 2016-11-28 MED ORDER — IOPAMIDOL (ISOVUE-300) INJECTION 61%
INTRAVENOUS | Status: AC
  Filled 2016-11-28: qty 30

## 2016-11-28 MED ORDER — PROMETHAZINE HCL 25 MG PO TABS: ORAL_TABLET | ORAL | 0 refills | Status: DC

## 2016-11-28 NOTE — Telephone Encounter (Signed)
pts boyfriend- Larry,called- pt has been up all night with abd pain and vomiting.They spoke with NUR last nigh, pt does not want to go to the ED, due to cost.  He said she didn't go to work today which is very unusual for her.  Boyfriend is worried that something has gone wrong with the capsule. No fever, pt was constipated a couple of days ago and took a laxative and had some diarrhea. But bm's have been fine since then.  We called in prevpac yesterday but this pain started prior to her taking the prevpac.  He is calling because the pt does not have cell service at her house.Pt was able to communicate via mychart but something is wrong with her wifi today and she cannot get it to load.  Pt is on LSL schedule to read capsule today.

## 2016-11-28 NOTE — Patient Instructions (Signed)
1. Please have your labs done at hospital and if you can collect stool specimen drop that off at the hospital as well. 2. CT scan as ordered. Wait at the hospital until we call you with results.  3. RX given for pain medication. Do not take over the counter tylenol with it.  4. Phenergan rx sent to pharmacy.

## 2016-11-28 NOTE — Telephone Encounter (Signed)
pts boyfriend- Fritz Pickerel is aware. Pt put on the schedule for LSL today at 2pm and schedule ok'd by [facility] to block 10:30 tomorrow to read capsule.

## 2016-11-28 NOTE — Assessment & Plan Note (Signed)
46 year old female seen urgently for 2 day history of acute onset left lower quadrant pain associated with vomiting. She's also had diarrhea for 6 days since her colonoscopy, previously predominant constipated. She is having diarrhea every time she drinks. Nocturnal diarrhea as well. No blood seen. No recent antibiotic use. No ill contacts. Colonoscopy as outlined above with no polypectomies or biopsies. Unlikely complication from colonoscopy but in the differential. She may have coincidental gastroenteritis or even diverticulitis. Given significant pain on exam today we will update labs including CBC, serum hCG qualitative test. Plan on stat CT abdomen pelvis with contrast. Stat C. difficile, GI pathogen panel as well. I have provided her with a prescription for Vicodin, #20, take 1 every 4 hours as needed for pain as well as Phenergan. She will stay in the x-ray department until she hears from [place] with CT results in case she requires admission.  Patient also has iron deficiency anemia. Outside of H pylori really no significant findings on recent EGD and colonoscopy. Capsule study final results are pending although quick review reveals capsule made it to the colon and unlikely a cause of her acute symptoms.

## 2016-11-28 NOTE — Telephone Encounter (Signed)
Quick read on Samantha Moreno, capsule made it to colon. None of these symptoms are related to her capsule test.   We do need her to get into the office today. Ask her to come in at 2pm today. Please block my 10:30 tomorrow if ok with Rosendo Gros as to allow time to complete patient's Samantha Moreno since she is now coming in for urgent OV today.

## 2016-11-28 NOTE — Progress Notes (Signed)
Primary Care Physician: Kathyrn Drown, MD  Primary Gastroenterologist:  Garfield Cornea, MD   Chief Complaint  Patient presents with  . Abdominal Pain    lower abd pain.     HPI: Samantha Moreno is a 46 y.o. female here for same-day office visit for history of abdominal pain, vomiting. Patient was seen initially on June 8 for iron deficiency anemia. Also complained of lower abdominal pain and nausea at the time. Labs in mid May revealed hemoglobin is 7.6, microcytic, ferritin 5, TIBC 541. She was started on oral iron replacement. Denies heavy menses. LMP 10/12/16. Pregnancy test was negative 11/15/16. Complained of dark stool after starting iron. Had vomited only once at that time.Hemoglobin up to 10.2 on June 8.  EGD and colonoscopy on 11/22/2016. She had H. pylori gastritis. Diverticulosis in the sigmoid colon and descending colon. Small bowel capsule endoscopy results pending, she swallowed camera on June 19.  For the past 2 days she's had pretty significant left lower quadrant pain, yesterday it was up to a 9 on a scale out of 10 in severity. Radiates upward into the upper abdomen. Vomited several times. Having frequent loose stools since her colonoscopy. Prior that she was having constipation. Anytime she eats or drinks she has diarrhea. Complains of nocturnal diarrhea. Denies any recent antibiotic use or ill contacts. She took her first dose of Prevpac yesterday evening but was [name] having these symptoms before that. Denies melena or rectal bleeding. Really no upper GI symptoms like heartburn. She consumes well water. Has not eaten out since her procedure. Buys her eggs from the grocery store. Still has not had a menstrual cycle since May 5. She had a little spotting yesterday but it was faint. No fever or chills.  Current Outpatient Prescriptions  Medication Sig Dispense Refill  . amoxicillin-clarithromycin-lansoprazole (PREVPAC) combo pack Take by mouth 2 (two) times daily.  Follow package directions. 1 kit 0  . metoprolol succinate (TOPROL-XL) 25 MG 24 hr tablet Take 1 tablet (25 mg total) by mouth daily. 30 tablet 2  . nitroGLYCERIN (NITROSTAT) 0.4 MG SL tablet Place 1 tablet under the tongue every 5 (five) minutes as needed for chest pain.     . Vitamin D, Ergocalciferol, (DRISDOL) 50000 units CAPS capsule Take 1 capsule (50,000 Units total) by mouth every 7 (seven) days. 4 capsule 2  . ferrous sulfate 325 (65 FE) MG tablet Take 325 mg by mouth 2 (two) times daily.    .       . ibuprofen (ADVIL,MOTRIN) 200 MG tablet Take 200 mg by mouth every 6 (six) hours as needed.          0   No current facility-administered medications for this visit.    Facility-Administered Medications Ordered in Other Visits  Medication Dose Route Frequency Provider Last Rate Last Dose  . iopamidol (ISOVUE-300) 61 % injection             Allergies as of 11/28/2016  . (No Known Allergies)   Past Medical History:  Diagnosis Date  . Gestational diabetes   . Headache   . HTN (hypertension)    Past Medical History:  Diagnosis Date  . Gestational diabetes   . Headache   . HTN (hypertension)    Past Surgical History:  Procedure Laterality Date  . COLONOSCOPY N/A 11/22/2016   Dr. Gala Romney: Distal 5 cm of terminal ileum normal. Diverticulosis in the sigmoid colon and descending colon.  . ESOPHAGOGASTRODUODENOSCOPY N/A 11/22/2016  Dr. Gala Romney: H. pylori gastritis.  Marland Kitchen exploratory laparoscopy  1992/1993   abdominal pain after second child    Family History  Problem Relation Age of Onset  . Stroke Mother   . Heart disease Mother   . Stroke Father   . Diabetes Father   . Hypertension Father   . Prostate cancer Father   . Heart attack Sister   . Colon cancer Neg Hx   . Colon polyps Neg Hx    Social History  Substance Use Topics  . Smoking status: Former Smoker    Quit date: 03/20/1991  . Smokeless tobacco: Never Used     Comment: smoke for 2 years around age 77  . Alcohol  use Yes     Comment: occasionally    ROS:  General: Negative for anorexia, weight loss, fever, chills,  Weakness.+++fatigue ENT: Negative for hoarseness, difficulty swallowing , nasal congestion. CV: Negative for chest pain, angina, palpitations, dyspnea on exertion, peripheral edema.  Respiratory: Negative for dyspnea at rest, dyspnea on exertion, cough, sputum, wheezing.  GI: See history of present illness. GU:  Negative for dysuria, hematuria, urinary incontinence, urinary frequency, nocturnal urination.  Endo: Negative for unusual weight change.    Physical Examination:   BP (!) 137/93   Pulse 88   Temp 97.1 F (36.2 C) (Oral)   Ht _0  (1.575 m)   Wt 141 lb 3.2 oz (64 kg)   LMP 10/12/2016   BMI 25.83 kg/m   General: Well-nourished, well-developed in no acute distress.  Eyes: No icterus. Mouth: Oropharyngeal mucosa moist and pink , no lesions erythema or exudate. Lungs: Clear to auscultation bilaterally.  Heart: Regular rate and rhythm, no murmurs rubs or gallops.  Abdomen: Bowel sounds are normal,  nondistended, no hepatosplenomegaly or masses, no abdominal bruits or hernia , no rebound or guarding.  Moderate to severe left lower quadrant tenderness, moderate tenderness in the left midabdomen is well Extremities: No lower extremity edema. No clubbing or deformities. Neuro: Alert and oriented x 4   Skin: Warm and dry, no jaundice.   Psych: Alert and cooperative, normal mood and affect.  Labs:  Lab Results  Component Value Date   WBC 7.6 11/15/2016   HGB 10.2 (L) 11/15/2016   HCT 34.9 (L) 11/15/2016   MCV 73.0 (L) 11/15/2016   PLT 443 (H) 11/15/2016   Lab Results  Component Value Date   CREATININE 0.63 10/25/2016   BUN 10 10/25/2016   NA 139 10/25/2016   K 4.5 10/25/2016   CL 104 10/25/2016   CO2 21 10/25/2016   Lab Results  Component Value Date   ALT 30 10/25/2016   AST 33 10/25/2016   ALKPHOS 140 (H) 10/25/2016   BILITOT 0.4 10/25/2016    Imaging  Studies: Mm Digital Screening Bilateral  Result Date: 11/27/2016 CLINICAL DATA:  Screening. EXAM: DIGITAL SCREENING BILATERAL MAMMOGRAM WITH CAD COMPARISON:  None. ACR Breast Density Category c: The breast tissue is heterogeneously dense, which may obscure small masses FINDINGS: There are no findings suspicious for malignancy. Images were processed with CAD. IMPRESSION: No mammographic evidence of malignancy. A result letter of this screening mammogram will be mailed directly to the patient. RECOMMENDATION: Screening mammogram in one year. (Code:SM-B-01Y) BI-RADS CATEGORY  1: Negative. Electronically Signed   By: Margarette Canada M.D.   On: 11/27/2016 11:03   Impression/plan: 46 year old female seen urgently for 2 day history of acute onset left lower quadrant pain associated with vomiting. She's also had diarrhea for 6  days since her colonoscopy, previously predominant constipated. She is having diarrhea every time she drinks. Nocturnal diarrhea as well. No blood seen. No recent antibiotic use. No ill contacts. Colonoscopy as outlined above with no polypectomies or biopsies. Unlikely complication from colonoscopy but in the differential. She may have coincidental gastroenteritis or even diverticulitis. Given significant pain on exam today we will update labs including CBC, serum hCG qualitative test. Plan on stat CT abdomen pelvis with contrast. Stat C. difficile, GI pathogen panel as well. I have provided her with a prescription for Vicodin, #20, take 1 every 4 hours as needed for pain as well as Phenergan. She will stay in the x-ray department until she hears from [place] with CT results in case she requires admission.  Patient also has iron deficiency anemia. Outside of H pylori really no significant findings on recent EGD and colonoscopy. Capsule study final results are pending although quick review reveals capsule made it to the colon and unlikely a cause of her acute symptoms.

## 2016-11-28 NOTE — Progress Notes (Signed)
CT tech to advise patient of no acute findings on CT and labs. Plan as outlined at time of ov earlier today. Await stools. Will touch base with her in the morning for progress report.

## 2016-11-29 ENCOUNTER — Encounter: Payer: Self-pay | Admitting: Gastroenterology

## 2016-11-29 ENCOUNTER — Encounter (HOSPITAL_COMMUNITY): Payer: Self-pay | Admitting: Internal Medicine

## 2016-11-29 NOTE — Progress Notes (Signed)
Cdiff is negative. preg test negative. Hgb up to 11.2. WBC up slightly but can be up in setting of pain. LMOAM for return call to see how she is doing with abd pain and diarrhea.

## 2016-11-29 NOTE — Progress Notes (Signed)
cc'ed to pcp °

## 2016-11-29 NOTE — Progress Notes (Signed)
CT scan showed some fat in the liver but nothing to explain abdominal pain or diarrhea. I left message on cell phone for return call to see how she feels today. Also advised her that we would send info via Mychart as well.

## 2016-12-01 ENCOUNTER — Encounter: Payer: Self-pay | Admitting: Gastroenterology

## 2016-12-02 ENCOUNTER — Encounter (HOSPITAL_COMMUNITY)
Admission: RE | Admit: 2016-12-02 | Discharge: 2016-12-02 | Disposition: A | Discharge status: Home / Self Care | Payer: BLUE CROSS/BLUE SHIELD | Source: Ambulatory Visit | Attending: Gastroenterology | Admitting: Gastroenterology

## 2016-12-02 ENCOUNTER — Other Ambulatory Visit: Payer: Self-pay

## 2016-12-02 ENCOUNTER — Encounter: Payer: Self-pay | Admitting: Nurse Practitioner

## 2016-12-02 ENCOUNTER — Telehealth: Payer: Self-pay | Admitting: Gastroenterology

## 2016-12-02 ENCOUNTER — Other Ambulatory Visit: Payer: Self-pay | Admitting: Gastroenterology

## 2016-12-02 DIAGNOSIS — D509 Iron deficiency anemia, unspecified: Secondary | ICD-10-CM

## 2016-12-02 DIAGNOSIS — A048 Other specified bacterial intestinal infections: Secondary | ICD-10-CM

## 2016-12-02 DIAGNOSIS — N921 Excessive and frequent menstruation with irregular cycle: Secondary | ICD-10-CM

## 2016-12-02 DIAGNOSIS — N946 Dysmenorrhea, unspecified: Secondary | ICD-10-CM

## 2016-12-02 MED ORDER — SODIUM CHLORIDE 0.9 % IV SOLN
Freq: Once | INTRAVENOUS | Status: AC
  Administered 2016-12-02: 250 mL via INTRAVENOUS

## 2016-12-02 MED ORDER — SODIUM CHLORIDE 0.9 % IV SOLN
510.0000 mg | Freq: Once | INTRAVENOUS | Status: AC
  Administered 2016-12-02: 510 mg via INTRAVENOUS
  Filled 2016-12-02: qty 17

## 2016-12-02 NOTE — Progress Notes (Signed)
feraheme infused. Tolerated well. Voiced no c/o at this time. D/c to home in good condition.

## 2016-12-02 NOTE — Telephone Encounter (Signed)
Patient aware of small bowel capsule results. However, we need the following:  Would advise completion of H.pylori treatment and will test for eradication via H.pylori stool antigen in couple of months. Please NIC for 2 months from now, must be off PPI and abx for 2 weeks before.   Would recommend CBC, ferritin, TTG IgA, IgA level in 3-4 weeks. She may be NIC for H/H or CBC already in 4 weeks, please update.   OV with RMR in 2 months for abd pain, IDA.   Once Prevpac done, I would recommend pantoprazole 40mg  daily on empty stomach before BF, #30, 2 refills. Limit NSAIDS/ASA.

## 2016-12-02 NOTE — Progress Notes (Signed)
Please plan for CBC, ferritin in four weeks.

## 2016-12-02 NOTE — Op Note (Signed)
  Small Bowel [place]ens Capsule Study Procedure date:  11/26/16  Referring Provider:  Garfield Cornea, MD  PCP:  Dr. Kathyrn Drown, MD  Indication for procedure:  IDA. EGD and colonoscopy 11/22/16. She had H.pylori gastritis. Diverticulosis. Hemoccult status unknown but no overt GI bleeding and no report of heavy menses.   Patient data:  Wt: 141 lb Ht: 5'2" Waist: unknown  Findings:  Single erosion in small bowel. Nothing to explain IDA.   First Gastric image:  40 sec First Duodenal image: 16 min 53 sec First Ileo-Cecal Valve image: 3 hr 42 min 38 sec First Cecal image: 4 hr 5 min 13 sec Gastric Passage time: 16 min  Small Bowel Passage time:  3hr 26m    Summary & Recommendations: 46 y/o female with recent diagnosis of profound IDA, hemoccult status unknown, with H.pylori gastritis on recent EGD. Currently undergoing Prevpac. Denies heavy menses. Small bowel capsule essentially unremarkable. No explanation for IDA from small bowel standpoint.   Would advise completion of H.pylori treatment and will test for eradication via H.pylori stool antigen in couple of months. Would continue to follow H/H, she is receiving oral iron and iron infusion. Consider celiac serologies.   Pertinent images reviewed by Dr. Gala Romney.   Laureen Ochs. Bernarda Caffey [facility] [phone] 6/25/201811:11 PM   Pertinent images reviewed.  Agree with above assessment and recommendations

## 2016-12-03 ENCOUNTER — Other Ambulatory Visit: Payer: Self-pay

## 2016-12-03 ENCOUNTER — Other Ambulatory Visit: Payer: Self-pay | Admitting: Gastroenterology

## 2016-12-03 ENCOUNTER — Encounter: Payer: Self-pay | Admitting: Internal Medicine

## 2016-12-03 DIAGNOSIS — R197 Diarrhea, unspecified: Secondary | ICD-10-CM

## 2016-12-03 DIAGNOSIS — R1032 Left lower quadrant pain: Secondary | ICD-10-CM

## 2016-12-03 MED ORDER — PANTOPRAZOLE SODIUM 40 MG PO TBEC: 40.0000 mg | DELAYED_RELEASE_TABLET | Freq: Every day | ORAL | 2 refills | Status: DC

## 2016-12-03 NOTE — Telephone Encounter (Signed)
hpylori future orders on file.

## 2016-12-03 NOTE — Telephone Encounter (Signed)
I have sent the pt a message in [place] with these instructions. I have mailed the lab orders to her. rx has been sent to the pharmacy. Please schedule ov with RMR.

## 2016-12-03 NOTE — Addendum Note (Signed)
Addended by: Claudina Lick on: 12/03/2016 11:22 AM   Modules accepted: Orders

## 2016-12-03 NOTE — Telephone Encounter (Signed)
APPT MADE AND LETTER SENT  °

## 2016-12-09 ENCOUNTER — Ambulatory Visit (HOSPITAL_COMMUNITY)
Admission: RE | Admit: 2016-12-09 | Discharge: 2016-12-09 | Disposition: A | Discharge status: Home / Self Care | Payer: BLUE CROSS/BLUE SHIELD | Source: Ambulatory Visit | Attending: Nurse Practitioner | Admitting: Nurse Practitioner

## 2016-12-09 ENCOUNTER — Encounter: Payer: Self-pay | Admitting: Nurse Practitioner

## 2016-12-09 DIAGNOSIS — N83202 Unspecified ovarian cyst, left side: Secondary | ICD-10-CM | POA: Insufficient documentation

## 2016-12-09 DIAGNOSIS — N83201 Unspecified ovarian cyst, right side: Secondary | ICD-10-CM | POA: Insufficient documentation

## 2016-12-09 DIAGNOSIS — N921 Excessive and frequent menstruation with irregular cycle: Secondary | ICD-10-CM | POA: Insufficient documentation

## 2016-12-09 DIAGNOSIS — N946 Dysmenorrhea, unspecified: Secondary | ICD-10-CM | POA: Diagnosis not present

## 2016-12-09 DIAGNOSIS — D259 Leiomyoma of uterus, unspecified: Secondary | ICD-10-CM | POA: Diagnosis not present

## 2016-12-10 ENCOUNTER — Other Ambulatory Visit: Payer: Self-pay | Admitting: Nurse Practitioner

## 2016-12-10 DIAGNOSIS — R7989 Other specified abnormal findings of blood chemistry: Secondary | ICD-10-CM

## 2016-12-12 ENCOUNTER — Other Ambulatory Visit: Payer: Self-pay

## 2016-12-12 DIAGNOSIS — A048 Other specified bacterial intestinal infections: Secondary | ICD-10-CM

## 2016-12-16 ENCOUNTER — Encounter: Payer: Self-pay | Admitting: Internal Medicine

## 2016-12-20 ENCOUNTER — Encounter: Payer: Self-pay | Admitting: Nurse Practitioner

## 2017-01-06 ENCOUNTER — Other Ambulatory Visit: Payer: Self-pay | Admitting: Gastroenterology

## 2017-01-07 LAB — CBC WITH DIFFERENTIAL/PLATELET
Basophils Absolute: 86 cells/uL (ref 0–200)
Basophils Relative: 1 %
EOS PCT: 2 %
Eosinophils Absolute: 172 cells/uL (ref 15–500)
HCT: 36.9 % (ref 35.0–45.0)
HEMOGLOBIN: 11.7 g/dL (ref 11.7–15.5)
LYMPHS ABS: 2494 {cells}/uL (ref 850–3900)
LYMPHS PCT: 29 %
MCH: 26.7 pg — ABNORMAL LOW (ref 27.0–33.0)
MCHC: 31.7 g/dL — AB (ref 32.0–36.0)
MCV: 84.2 fL (ref 80.0–100.0)
MPV: 9.6 fL (ref 7.5–12.5)
Monocytes Absolute: 516 cells/uL (ref 200–950)
Monocytes Relative: 6 %
Neutro Abs: 5332 cells/uL (ref 1500–7800)
Neutrophils Relative %: 62 %
Platelets: 384 10*3/uL (ref 140–400)
RBC: 4.38 MIL/uL (ref 3.80–5.10)
RDW: 22.4 % — AB (ref 11.0–15.0)
WBC: 8.6 10*3/uL (ref 3.8–10.8)

## 2017-01-07 LAB — IGA: IGA: 159 mg/dL (ref 81–463)

## 2017-01-07 LAB — TISSUE TRANSGLUTAMINASE, IGA: Tissue Transglutaminase Ab, IgA: 1 U/mL (ref <4)

## 2017-01-07 LAB — FERRITIN: FERRITIN: 54 ng/mL (ref 10–232)

## 2017-01-09 ENCOUNTER — Encounter: Payer: Self-pay | Admitting: Gastroenterology

## 2017-01-10 ENCOUNTER — Encounter: Payer: Self-pay | Admitting: Gastroenterology

## 2017-01-10 NOTE — Progress Notes (Signed)
Negative celiac serologies. Ferritin improved to 54. Hgb slightly improved. Further recommendations per Magda Paganini who saw her last. I responded to her message in [place].

## 2017-01-19 NOTE — Progress Notes (Signed)
Anemia resolved, unclear explanation for profound IDA previously.  Keep OV with RMR as scheduled, VERY IMPORTANT SHE FOLLOWS UP WITH RMR.

## 2017-01-22 ENCOUNTER — Encounter: Payer: Self-pay | Admitting: Nurse Practitioner

## 2017-01-29 DIAGNOSIS — R946 Abnormal results of thyroid function studies: Secondary | ICD-10-CM | POA: Diagnosis not present

## 2017-01-30 LAB — TSH: TSH: 4.36 u[IU]/mL (ref 0.450–4.500)

## 2017-01-30 LAB — T4, FREE: Free T4: 0.93 ng/dL (ref 0.82–1.77)

## 2017-01-31 ENCOUNTER — Other Ambulatory Visit: Payer: Self-pay | Admitting: Nurse Practitioner

## 2017-01-31 ENCOUNTER — Ambulatory Visit: Payer: BLUE CROSS/BLUE SHIELD | Admitting: Internal Medicine

## 2017-02-03 ENCOUNTER — Other Ambulatory Visit: Payer: Self-pay | Admitting: Gastroenterology

## 2017-02-03 DIAGNOSIS — R809 Proteinuria, unspecified: Secondary | ICD-10-CM | POA: Diagnosis not present

## 2017-02-03 DIAGNOSIS — R1084 Generalized abdominal pain: Secondary | ICD-10-CM | POA: Diagnosis not present

## 2017-02-03 DIAGNOSIS — D509 Iron deficiency anemia, unspecified: Secondary | ICD-10-CM | POA: Diagnosis not present

## 2017-02-03 DIAGNOSIS — A048 Other specified bacterial intestinal infections: Secondary | ICD-10-CM | POA: Diagnosis not present

## 2017-02-04 ENCOUNTER — Ambulatory Visit: Payer: BLUE CROSS/BLUE SHIELD | Admitting: Internal Medicine

## 2017-02-04 LAB — HELICOBACTER PYLORI  SPECIAL ANTIGEN: H. PYLORI Antigen: NOT DETECTED

## 2017-02-05 ENCOUNTER — Encounter: Payer: Self-pay | Admitting: Gastroenterology

## 2017-02-05 NOTE — Progress Notes (Signed)
Message sent in Mychart.

## 2017-02-06 DIAGNOSIS — R748 Abnormal levels of other serum enzymes: Secondary | ICD-10-CM | POA: Diagnosis not present

## 2017-03-04 ENCOUNTER — Ambulatory Visit: Payer: BLUE CROSS/BLUE SHIELD | Admitting: Internal Medicine

## 2017-03-06 DIAGNOSIS — E611 Iron deficiency: Secondary | ICD-10-CM | POA: Diagnosis not present

## 2017-03-06 DIAGNOSIS — M2550 Pain in unspecified joint: Secondary | ICD-10-CM | POA: Diagnosis not present

## 2017-03-06 DIAGNOSIS — D500 Iron deficiency anemia secondary to blood loss (chronic): Secondary | ICD-10-CM | POA: Diagnosis not present

## 2017-03-11 ENCOUNTER — Ambulatory Visit: Payer: BLUE CROSS/BLUE SHIELD | Admitting: Internal Medicine

## 2017-03-20 DIAGNOSIS — M2550 Pain in unspecified joint: Secondary | ICD-10-CM | POA: Diagnosis not present

## 2017-03-20 DIAGNOSIS — D500 Iron deficiency anemia secondary to blood loss (chronic): Secondary | ICD-10-CM | POA: Diagnosis not present

## 2017-03-20 DIAGNOSIS — M25541 Pain in joints of right hand: Secondary | ICD-10-CM | POA: Diagnosis not present

## 2017-03-20 DIAGNOSIS — R5383 Other fatigue: Secondary | ICD-10-CM | POA: Diagnosis not present

## 2017-03-20 DIAGNOSIS — M791 Myalgia, unspecified site: Secondary | ICD-10-CM | POA: Diagnosis not present

## 2017-03-20 DIAGNOSIS — M25542 Pain in joints of left hand: Secondary | ICD-10-CM | POA: Diagnosis not present

## 2017-03-20 DIAGNOSIS — E611 Iron deficiency: Secondary | ICD-10-CM | POA: Diagnosis not present

## 2017-03-20 DIAGNOSIS — R21 Rash and other nonspecific skin eruption: Secondary | ICD-10-CM | POA: Diagnosis not present

## 2017-03-24 ENCOUNTER — Ambulatory Visit (INDEPENDENT_AMBULATORY_CARE_PROVIDER_SITE_OTHER): Payer: BLUE CROSS/BLUE SHIELD | Admitting: Family Medicine

## 2017-03-24 ENCOUNTER — Encounter: Payer: Self-pay | Admitting: Family Medicine

## 2017-03-24 VITALS — BP 148/98 | Temp 99.2°F | Ht 62.0 in | Wt 144.0 lb

## 2017-03-24 DIAGNOSIS — R079 Chest pain, unspecified: Secondary | ICD-10-CM | POA: Diagnosis not present

## 2017-03-24 DIAGNOSIS — I10 Essential (primary) hypertension: Secondary | ICD-10-CM | POA: Diagnosis not present

## 2017-03-24 DIAGNOSIS — E785 Hyperlipidemia, unspecified: Secondary | ICD-10-CM

## 2017-03-24 MED ORDER — CYCLOBENZAPRINE HCL 5 MG PO TABS: ORAL_TABLET | ORAL | 1 refills | Status: DC

## 2017-03-24 MED ORDER — HYDROCODONE-ACETAMINOPHEN 5-325 MG PO TABS: 1.0000 | ORAL_TABLET | Freq: Four times a day (QID) | ORAL | 0 refills | Status: DC | PRN

## 2017-03-24 MED ORDER — METOPROLOL SUCCINATE ER 25 MG PO TB24: 25.0000 mg | ORAL_TABLET | Freq: Every day | ORAL | 12 refills | Status: DC

## 2017-03-24 MED ORDER — AMLODIPINE BESYLATE 5 MG PO TABS: 5.0000 mg | ORAL_TABLET | Freq: Every day | ORAL | 5 refills | Status: DC

## 2017-03-24 NOTE — Progress Notes (Signed)
   Subjective:    Patient ID: Samantha Moreno, female    DOB: 03-22-1971, 46 y.o.   MRN: [id number]  Hypertension  The current episode started yesterday. Associated symptoms include headaches. Pertinent negatives include no chest pain or shortness of breath. Treatments tried: metoprolol.   Headache, vomiting, low grade fever, ear pain. Started today.  Right arm pain. Started today. She also relates aches in her legs that are worse in the evening time. In addition to this she denies any shortness of breath. She states that her blood pressures been high enough to cause her headaches. She worries about her heart but denies any chest pressure or tightness. She does have a family history of heart disease. The patient herself saw cardiology last year and they did do testing which included a CAT scan which showed aortic atherosclerosis but they also did a BX calcium score which 0   Review of Systems  Constitutional: Positive for fever. Negative for activity change.  HENT: Positive for ear pain. Negative for congestion and rhinorrhea.   Eyes: Negative for discharge.  Respiratory: Negative for cough, shortness of breath and wheezing.   Cardiovascular: Negative for chest pain.  Neurological: Positive for headaches.       Objective:   Physical Exam  Constitutional: She appears well-nourished. No distress.  HENT:  Head: Normocephalic.  Right Ear: External ear normal.  Left Ear: External ear normal.  Eyes: Right eye exhibits no discharge. Left eye exhibits no discharge.  Neck: No tracheal deviation present.  Cardiovascular: Normal rate, regular rhythm and normal heart sounds.   No murmur heard. Pulmonary/Chest: Effort normal and breath sounds normal. No respiratory distress. She has no wheezes. She has no rales.  Musculoskeletal: She exhibits no edema.  Lymphadenopathy:    She has no cervical adenopathy.  Neurological: She is alert.  Psychiatric: Her behavior is normal.  Vitals  reviewed.  Blood pressure was checked with wall unit as well as her unit in both readings were elevated her unit readings approximately 5-7 mm greater with the diastolic then with the wall unit did       Assessment & Plan:   HTN-ignificant elevation. add amlodipine 5 mg daily.Continue metoprolol daily, Healthy diet recommended, patient does not smoke, I doubt coronary artery disease based on what I am hearing, I do recommend checking lab work await the results  Patient will give [place] update in 48 hours how she is doing she will follow-up in several weeks  More than likely a viral illness I find no evidence of any type of underlying pneumonia or bacterial infection this should gradually get better over the course of the next week to 10 days call certainly if worse  There is also some element of leg discomforts at night which could be related to her iron deficiency she is getting this addressed by specialist. She may use Flexeril 5 mg daily at bedtime to help her and when necessary hydrocodone but not for frequent use

## 2017-03-24 NOTE — Patient Instructions (Signed)
DASH Eating Plan DASH stands for "Dietary Approaches to Stop Hypertension." The DASH eating plan is a healthy eating plan that has been shown to reduce high blood pressure (hypertension). It may also reduce your risk for type 2 diabetes, heart disease, and stroke. The DASH eating plan may also help with weight loss. What are tips for following this plan? General guidelines  Avoid eating more than 2,300 mg (milligrams) of salt (sodium) a day. If you have hypertension, you may need to reduce your sodium intake to 1,500 mg a day.  Limit alcohol intake to no more than 1 drink a day for nonpregnant women and 2 drinks a day for men. One drink equals 12 oz of beer, 5 oz of wine, or 1 oz of hard liquor.  Work with your health care provider to maintain a healthy body weight or to lose weight. Ask what an ideal weight is for you.  Get at least 30 minutes of exercise that causes your heart to beat faster (aerobic exercise) most days of the week. Activities may include walking, swimming, or biking.  Work with your health care provider or diet and nutrition specialist (dietitian) to adjust your eating plan to your individual calorie needs. Reading food labels  Check food labels for the amount of sodium per serving. Choose foods with less than 5 percent of the Daily Value of sodium. Generally, foods with less than 300 mg of sodium per serving fit into this eating plan.  To find whole grains, look for the word "whole" as the first word in the ingredient list. Shopping  Buy products labeled as "low-sodium" or "no salt added."  Buy fresh foods. Avoid canned foods and premade or frozen meals. Cooking  Avoid adding salt when cooking. Use salt-free seasonings or herbs instead of table salt or sea salt. Check with your health care provider or pharmacist before using salt substitutes.  Do not fry foods. Cook foods using healthy methods such as baking, boiling, grilling, and broiling instead.  Cook with  heart-healthy oils, such as olive, canola, soybean, or sunflower oil. Meal planning   Eat a balanced diet that includes: ? 5 or more servings of fruits and vegetables each day. At each meal, try to fill half of your plate with fruits and vegetables. ? Up to 6-8 servings of whole grains each day. ? Less than 6 oz of lean meat, poultry, or fish each day. A 3-oz serving of meat is about the same size as a deck of cards. One egg equals 1 oz. ? 2 servings of low-fat dairy each day. ? A serving of nuts, seeds, or beans 5 times each week. ? Heart-healthy fats. Healthy fats called Omega-3 fatty acids are found in foods such as flaxseeds and coldwater fish, like sardines, salmon, and mackerel.  Limit how much you eat of the following: ? Canned or prepackaged foods. ? Food that is high in trans fat, such as fried foods. ? Food that is high in saturated fat, such as fatty meat. ? Sweets, desserts, sugary drinks, and other foods with added sugar. ? Full-fat dairy products.  Do not salt foods before eating.  Try to eat at least 2 vegetarian meals each week.  Eat more home-cooked food and less restaurant, buffet, and fast food.  When eating at a restaurant, ask that your food be prepared with less salt or no salt, if possible. What foods are recommended? The items listed may not be a complete list. Talk with your dietitian about what   dietary choices are best for you. Grains Whole-grain or whole-wheat bread. Whole-grain or whole-wheat pasta. Brown rice. Oatmeal. Quinoa. Bulgur. Whole-grain and low-sodium cereals. Pita bread. Low-fat, low-sodium crackers. Whole-wheat flour tortillas. Vegetables Fresh or frozen vegetables (raw, steamed, roasted, or grilled). Low-sodium or reduced-sodium tomato and vegetable juice. Low-sodium or reduced-sodium tomato sauce and tomato paste. Low-sodium or reduced-sodium canned vegetables. Fruits All fresh, dried, or frozen fruit. Canned fruit in natural juice (without  added sugar). Meat and other protein foods Skinless chicken or turkey. Ground chicken or turkey. Pork with fat trimmed off. Fish and seafood. Egg whites. Dried beans, peas, or lentils. Unsalted nuts, nut butters, and seeds. Unsalted canned beans. Lean cuts of beef with fat trimmed off. Low-sodium, lean deli meat. Dairy Low-fat (1%) or fat-free (skim) milk. Fat-free, low-fat, or reduced-fat cheeses. Nonfat, low-sodium ricotta or cottage cheese. Low-fat or nonfat yogurt. Low-fat, low-sodium cheese. Fats and oils Soft margarine without trans fats. Vegetable oil. Low-fat, reduced-fat, or light mayonnaise and salad dressings (reduced-sodium). Canola, safflower, olive, soybean, and sunflower oils. Avocado. Seasoning and other foods Herbs. Spices. Seasoning mixes without salt. Unsalted popcorn and pretzels. Fat-free sweets. What foods are not recommended? The items listed may not be a complete list. Talk with your dietitian about what dietary choices are best for you. Grains Baked goods made with fat, such as croissants, muffins, or some breads. Dry pasta or rice meal packs. Vegetables Creamed or fried vegetables. Vegetables in a cheese sauce. Regular canned vegetables (not low-sodium or reduced-sodium). Regular canned tomato sauce and paste (not low-sodium or reduced-sodium). Regular tomato and vegetable juice (not low-sodium or reduced-sodium). Pickles. Olives. Fruits Canned fruit in a light or heavy syrup. Fried fruit. Fruit in cream or butter sauce. Meat and other protein foods Fatty cuts of meat. Ribs. Fried meat. Bacon. Sausage. Bologna and other processed lunch meats. Salami. Fatback. Hotdogs. Bratwurst. Salted nuts and seeds. Canned beans with added salt. Canned or smoked fish. Whole eggs or egg yolks. Chicken or turkey with skin. Dairy Whole or 2% milk, cream, and half-and-half. Whole or full-fat cream cheese. Whole-fat or sweetened yogurt. Full-fat cheese. Nondairy creamers. Whipped toppings.  Processed cheese and cheese spreads. Fats and oils Butter. Stick margarine. Lard. Shortening. Ghee. Bacon fat. Tropical oils, such as coconut, palm kernel, or palm oil. Seasoning and other foods Salted popcorn and pretzels. Onion salt, garlic salt, seasoned salt, table salt, and sea salt. Worcestershire sauce. Tartar sauce. Barbecue sauce. Teriyaki sauce. Soy sauce, including reduced-sodium. Steak sauce. Canned and packaged gravies. Fish sauce. Oyster sauce. Cocktail sauce. Horseradish that you find on the shelf. Ketchup. Mustard. Meat flavorings and tenderizers. Bouillon cubes. Hot sauce and Tabasco sauce. Premade or packaged marinades. Premade or packaged taco seasonings. Relishes. Regular salad dressings. Where to find more information:  National Heart, Lung, and Blood Institute: www.nhlbi.nih.gov  American Heart Association: www.heart.org Summary  The DASH eating plan is a healthy eating plan that has been shown to reduce high blood pressure (hypertension). It may also reduce your risk for type 2 diabetes, heart disease, and stroke.  With the DASH eating plan, you should limit salt (sodium) intake to 2,300 mg a day. If you have hypertension, you may need to reduce your sodium intake to 1,500 mg a day.  When on the DASH eating plan, aim to eat more fresh fruits and vegetables, whole grains, lean proteins, low-fat dairy, and heart-healthy fats.  Work with your health care provider or diet and nutrition specialist (dietitian) to adjust your eating plan to your individual   calorie needs. This information is not intended to replace advice given to you by your health care provider. Make sure you discuss any questions you have with your health care provider. Document Released: 05/16/2011 Document Revised: 05/20/2016 Document Reviewed: 05/20/2016 Elsevier Interactive Patient Education  2017 [employer].  

## 2017-03-25 ENCOUNTER — Other Ambulatory Visit: Payer: Self-pay

## 2017-03-25 DIAGNOSIS — R42 Dizziness and giddiness: Secondary | ICD-10-CM | POA: Diagnosis not present

## 2017-03-25 DIAGNOSIS — E785 Hyperlipidemia, unspecified: Secondary | ICD-10-CM | POA: Diagnosis not present

## 2017-03-25 DIAGNOSIS — I10 Essential (primary) hypertension: Secondary | ICD-10-CM | POA: Diagnosis not present

## 2017-03-25 DIAGNOSIS — H81399 Other peripheral vertigo, unspecified ear: Secondary | ICD-10-CM | POA: Diagnosis not present

## 2017-03-25 DIAGNOSIS — R9431 Abnormal electrocardiogram [ECG] [EKG]: Secondary | ICD-10-CM | POA: Diagnosis not present

## 2017-03-25 DIAGNOSIS — R0789 Other chest pain: Secondary | ICD-10-CM | POA: Diagnosis not present

## 2017-03-25 MED ORDER — PROMETHAZINE HCL 25 MG PO TABS: ORAL_TABLET | ORAL | 2 refills | Status: DC

## 2017-03-25 NOTE — Telephone Encounter (Signed)
Nurse's-please send in Phenergan 25 mg tablet one half tablet to 1 twice a day when necessary nausea caution drowsiness, #24, 2 refills

## 2017-03-25 NOTE — Addendum Note (Signed)
Addended by: Ofilia Neas R on: 03/25/2017 11:28 AM   Modules accepted: Orders

## 2017-03-26 ENCOUNTER — Encounter: Payer: Self-pay | Admitting: Family Medicine

## 2017-03-26 ENCOUNTER — Telehealth: Payer: Self-pay | Admitting: Family Medicine

## 2017-03-26 ENCOUNTER — Emergency Department (HOSPITAL_COMMUNITY)
Admission: EM | Admit: 2017-03-26 | Discharge: 2017-03-26 | Discharge status: Against Medical Advice | Payer: BLUE CROSS/BLUE SHIELD | Attending: Emergency Medicine | Admitting: Emergency Medicine

## 2017-03-26 ENCOUNTER — Emergency Department (HOSPITAL_COMMUNITY): Payer: BLUE CROSS/BLUE SHIELD

## 2017-03-26 ENCOUNTER — Encounter (HOSPITAL_COMMUNITY): Payer: Self-pay | Admitting: Emergency Medicine

## 2017-03-26 DIAGNOSIS — R112 Nausea with vomiting, unspecified: Secondary | ICD-10-CM | POA: Insufficient documentation

## 2017-03-26 DIAGNOSIS — I10 Essential (primary) hypertension: Secondary | ICD-10-CM | POA: Diagnosis not present

## 2017-03-26 DIAGNOSIS — F17200 Nicotine dependence, unspecified, uncomplicated: Secondary | ICD-10-CM | POA: Insufficient documentation

## 2017-03-26 DIAGNOSIS — R42 Dizziness and giddiness: Secondary | ICD-10-CM | POA: Insufficient documentation

## 2017-03-26 DIAGNOSIS — Z79899 Other long term (current) drug therapy: Secondary | ICD-10-CM | POA: Diagnosis not present

## 2017-03-26 DIAGNOSIS — R0602 Shortness of breath: Secondary | ICD-10-CM | POA: Diagnosis not present

## 2017-03-26 DIAGNOSIS — R079 Chest pain, unspecified: Secondary | ICD-10-CM | POA: Diagnosis not present

## 2017-03-26 DIAGNOSIS — Z5329 Procedure and treatment not carried out because of patient's decision for other reasons: Secondary | ICD-10-CM | POA: Insufficient documentation

## 2017-03-26 LAB — COMPREHENSIVE METABOLIC PANEL
ALBUMIN: 4 g/dL (ref 3.5–5.0)
ALT: 41 U/L (ref 14–54)
AST: 39 U/L (ref 15–41)
Alkaline Phosphatase: 117 U/L (ref 38–126)
Anion gap: 10 (ref 5–15)
BILIRUBIN TOTAL: 1.1 mg/dL (ref 0.3–1.2)
BUN: 14 mg/dL (ref 6–20)
CO2: 28 mmol/L (ref 22–32)
Calcium: 9.5 mg/dL (ref 8.9–10.3)
Chloride: 103 mmol/L (ref 101–111)
Creatinine, Ser: 0.63 mg/dL (ref 0.44–1.00)
GFR calc Af Amer: >60 mL/min (ref >60)
GFR calc non Af Amer: >60 mL/min (ref >60)
GLUCOSE: 96 mg/dL (ref 65–99)
POTASSIUM: 3.8 mmol/L (ref 3.5–5.1)
SODIUM: 141 mmol/L (ref 135–145)
TOTAL PROTEIN: 7.8 g/dL (ref 6.5–8.1)

## 2017-03-26 LAB — LIPID PANEL
CHOL/HDL RATIO: 6.8 ratio — AB (ref 0.0–4.4)
Cholesterol, Total: 204 mg/dL — ABNORMAL HIGH (ref 100–199)
HDL: 30 mg/dL — AB (ref >39)
LDL CALC: 118 mg/dL — AB (ref 0–99)
TRIGLYCERIDES: 280 mg/dL — AB (ref 0–149)
VLDL CHOLESTEROL CAL: 56 mg/dL — AB (ref 5–40)

## 2017-03-26 LAB — D-DIMER, QUANTITATIVE (NOT AT ARMC)

## 2017-03-26 LAB — TROPONIN I: Troponin I: <0.03 ng/mL (ref <0.03)

## 2017-03-26 LAB — BASIC METABOLIC PANEL
BUN / CREAT RATIO: 15 (ref 9–23)
BUN: 11 mg/dL (ref 6–24)
CHLORIDE: 100 mmol/L (ref 96–106)
CO2: 23 mmol/L (ref 20–29)
Calcium: 9.7 mg/dL (ref 8.7–10.2)
Creatinine, Ser: 0.71 mg/dL (ref 0.57–1.00)
GFR calc Af Amer: 118 mL/min/{1.73_m2} (ref >59)
GFR calc non Af Amer: 102 mL/min/{1.73_m2} (ref >59)
GLUCOSE: 110 mg/dL — AB (ref 65–99)
Potassium: 4.3 mmol/L (ref 3.5–5.2)
SODIUM: 140 mmol/L (ref 134–144)

## 2017-03-26 LAB — CBC
HEMATOCRIT: 37.8 % (ref 36.0–46.0)
HEMOGLOBIN: 12.1 g/dL (ref 12.0–15.0)
MCH: 28.4 pg (ref 26.0–34.0)
MCHC: 32 g/dL (ref 30.0–36.0)
MCV: 88.7 fL (ref 78.0–100.0)
Platelets: 381 10*3/uL (ref 150–400)
RBC: 4.26 MIL/uL (ref 3.87–5.11)
RDW: 14.1 % (ref 11.5–15.5)
WBC: 9.6 10*3/uL (ref 4.0–10.5)

## 2017-03-26 LAB — LIPASE, BLOOD: Lipase: 27 U/L (ref 11–51)

## 2017-03-26 MED ORDER — ONDANSETRON 8 MG PO TBDP
8.0000 mg | ORAL_TABLET | Freq: Once | ORAL | Status: DC
  Filled 2017-03-26: qty 1

## 2017-03-26 MED ORDER — MECLIZINE HCL 12.5 MG PO TABS
25.0000 mg | ORAL_TABLET | Freq: Once | ORAL | Status: DC
  Filled 2017-03-26: qty 2

## 2017-03-26 NOTE — Telephone Encounter (Signed)
Spoke with patient and patient has c/o severe vomiting, dizziness, shortness of breath, abdominal pain, chest pain, BP 116/94, Pulse 128. Patient states unsure if has fever has not checked it. States she went to [facility] ER and had some labs drawn but left the ER after being there for 8 hours without getting results to test or seeing MD. Discussed symptoms with Dr.Scott Luking in real time and was told to advise patient to go to the ER for further work up and testing. Patient verbalized understanding and stated that she may go to ER close to home or [facility].

## 2017-03-26 NOTE — ED Triage Notes (Signed)
PT c/o left sided pressure with SOB on exertion and dizziness with nausea x5 days. PT states she went to [facility] ED last night and had blood work and EKG but the wait was so long she didn't stay for treatment.

## 2017-03-26 NOTE — ED Notes (Signed)
Pt explained risks of leaving AMA. Pt verbalized understanding risks of leaving AMA.

## 2017-03-26 NOTE — Telephone Encounter (Signed)
Left message return call 03/26/17 

## 2017-03-26 NOTE — ED Provider Notes (Signed)
[facility] Provider Note   CSN: [id number] Arrival date & time: 03/26/17  1518     History   Chief Complaint Chief Complaint  Patient presents with  . Chest Pain    HPI Samantha Moreno is a 46 y.o. female.  HPI  Pt was seen at 2030. Per pt, c/o gradual onset and persistence of multiple complaints for the past 5 days. Complaints include: "dizziness" (described as spinning), N/V, chest "pressure," SOB. Pt was evaluated at [facility] ED yesterday, but LWBS. Denies palpitations, no cough, no abd pain, no diarrhea, no fevers, no black or blood in stools or emesis, no back pain, no visual changes, no focal motor weakness, no tingling/numbness in extremities, no slurred speech, no facial droop.    Past Medical History:  Diagnosis Date  . Gestational diabetes   . Headache   . HTN (hypertension)     Patient Active Problem List   Diagnosis Date Noted  . Dyslipidemia 03/24/2017  . LLQ pain 11/28/2016  . Nausea with vomiting 11/28/2016  . Diarrhea 11/28/2016  . Perimenopause 11/17/2016  . Iron deficiency anemia 10/28/2016  . Vitamin D deficiency 10/28/2016  . Essential hypertension 10/26/2016  . Bereavement 10/26/2016  . Other chest pain 04/24/2016  . Hyperlipidemia 03/21/2016  . Fasting hyperglycemia 03/21/2016    Past Surgical History:  Procedure Laterality Date  . COLONOSCOPY N/A 11/22/2016   Dr. Gala Romney: Distal 5 cm of terminal ileum normal. Diverticulosis in the sigmoid colon and descending colon.  . ESOPHAGOGASTRODUODENOSCOPY N/A 11/22/2016   Dr. Gala Romney: H. pylori gastritis.  Marland Kitchen exploratory laparoscopy  1992/1993   abdominal pain after second child   . GIVENS CAPSULE STUDY N/A 11/26/2016   Procedure: GIVENS CAPSULE STUDY;  Surgeon: Daneil Dolin, MD;  Location: [facility];  Service: Endoscopy;  Laterality: N/A;  700    OB History    No data available       Home Medications    Prior to Admission medications   Medication Sig Start Date End  Date Taking? Authorizing Provider  amLODipine (NORVASC) 5 MG tablet Take 1 tablet (5 mg total) by mouth daily. 03/24/17   Kathyrn Drown, MD  cyclobenzaprine (FLEXERIL) 5 MG tablet 1 qhs prn 03/24/17   Luking, Scott A, MD  ferrous sulfate 325 (65 FE) MG tablet Take 325 mg by mouth 2 (two) times daily.    [provider]  HYDROcodone-acetaminophen (NORCO/VICODIN) 5-325 MG tablet Take 1 tablet by mouth every 6 (six) hours as needed for moderate pain or severe pain. 03/24/17   Kathyrn Drown, MD  ibuprofen (ADVIL,MOTRIN) 200 MG tablet Take 200 mg by mouth every 6 (six) hours as needed.    [provider]  metoprolol succinate (TOPROL-XL) 25 MG 24 hr tablet Take 1 tablet (25 mg total) by mouth daily. 03/24/17   Kathyrn Drown, MD  nitroGLYCERIN (NITROSTAT) 0.4 MG SL tablet Place 1 tablet under the tongue every 5 (five) minutes as needed for chest pain.  11/06/16   [provider]  pantoprazole (PROTONIX) 40 MG tablet Take 1 tablet (40 mg total) by mouth daily. Patient not taking: Reported on 03/24/2017 12/03/16   Mahala Menghini, PA-C  promethazine (PHENERGAN) 25 MG tablet Take 1/2 to 1 tablet every four hours as needed for nausea/vomiting 03/25/17   Kathyrn Drown, MD  Vitamin D, Ergocalciferol, (DRISDOL) 50000 units CAPS capsule TAKE 1 CAPSULE BY MOUTH ONCE A WEEK 01/31/17   Kathyrn Drown, MD  Family History Family History  Problem Relation Age of Onset  . Stroke Mother   . Heart disease Mother   . Stroke Father   . Diabetes Father   . Hypertension Father   . Prostate cancer Father   . Heart attack Sister   . Colon cancer Neg Hx   . Colon polyps Neg Hx     Social History Social History  Substance Use Topics  . Smoking status: Former Smoker    Quit date: 03/20/1991  . Smokeless tobacco: Never Used     Comment: smoke for 2 years around age 48  . Alcohol use Yes     Comment: occasionally     Allergies   Patient has no known allergies.   Review of  Systems Review of Systems ROS: Statement: All systems negative except as marked or noted in the HPI; Constitutional: Negative for fever and chills. ; ; Eyes: Negative for eye pain, redness and discharge. ; ; ENMT: Negative for ear pain, hoarseness, nasal congestion, sinus pressure and sore throat. ; ; Cardiovascular: +CP. Negative for palpitations, diaphoresis, and peripheral edema. ; ; Respiratory: +SOB. Negative for cough, wheezing and stridor. ; ; Gastrointestinal: +N/V. Negative for diarrhea, abdominal pain, blood in stool, hematemesis, jaundice and rectal bleeding. . ; ; Genitourinary: Negative for dysuria, flank pain and hematuria. ; ; Musculoskeletal: Negative for back pain and neck pain. Negative for swelling and trauma.; ; Skin: Negative for pruritus, rash, abrasions, blisters, bruising and skin lesion.; ; Neuro: +"spinning." Negative for headache, lightheadedness and neck stiffness. Negative for weakness, altered level of consciousness, altered mental status, extremity weakness, paresthesias, involuntary movement, seizure and syncope.       Physical Exam Updated Vital Signs BP 134/87 (BP Location: Right Arm)   Pulse 85   Temp 98.5 F (36.9 C) (Oral)   Resp 18   Ht 5\' 2"  (1.575 m)   Wt 65.3 kg (144 lb)   LMP 03/21/2017   SpO2 100%   BMI 26.34 kg/m    Patient Vitals for the past 24 hrs:  BP Temp Temp src Pulse Resp SpO2 Height Weight  03/26/17 2012 134/87 - - 85 18 100 % - -  03/26/17 1639 (!) 148/75 98.5 F (36.9 C) Oral 76 (!) 22 99 % - -  03/26/17 1540 (!) 141/93 98.9 F (37.2 C) - 92 18 100 % - -  03/26/17 1539 - - - - - - 5\' 2"  (1.575 m) 65.3 kg (144 lb)     Physical Exam 2035: Physical examination:  Nursing notes reviewed; Vital signs and O2 SAT reviewed;  Constitutional: Well developed, Well nourished, Well hydrated, In no acute distress; Head:  Normocephalic, atraumatic; Eyes: EOMI, PERRL, No scleral icterus; ENMT: TM's clear bilat. +edemetous nasal turbinates bilat  with clear rhinorrhea. Mouth and pharynx normal, Mucous membranes moist; Neck: Supple, Full range of motion, No lymphadenopathy; Cardiovascular: Regular rate and rhythm, No gallop; Respiratory: Breath sounds clear & equal bilaterally, No wheezes.  Speaking full sentences with ease, Normal respiratory effort/excursion; Chest: Nontender, Movement normal; Abdomen: Soft, Nontender, Nondistended, Normal bowel sounds; Genitourinary: No CVA tenderness; Extremities: Pulses normal, No tenderness, No edema, No calf edema or asymmetry.; Neuro: AA&Ox3, Major CN grossly intact. +left horizontal end gaze fatigable nystagmus which reproduces pt's "dizziness." No facial droop. Speech clear. No gross focal motor or sensory deficits in extremities.; Skin: Color normal, Warm, Dry.   ED Treatments / Results  Labs (all labs ordered are listed, but only abnormal results are displayed)  EKG  EKG Interpretation  Date/Time:  Wednesday March 26 2017 15:37:27 EDT Ventricular Rate:  90 PR Interval:  152 QRS Duration: 86 QT Interval:  380 QTC Calculation: 464 R Axis:   -19 Text Interpretation:  Normal sinus rhythm Nonspecific T wave abnormality Prolonged QT No old tracing to compare Confirmed by Francine Graven [phone]) on 03/26/2017 9:16:24 PM       Radiology   Procedures Procedures (including critical care time)  Medications Ordered in ED Medications  ondansetron (ZOFRAN-ODT) disintegrating tablet 8 mg (8 mg Oral Refused 03/26/17 2115)  meclizine (ANTIVERT) tablet 25 mg (25 mg Oral Refused 03/26/17 2115)     Initial Impression / Assessment and Plan / ED Course  I have reviewed the triage vital signs and the nursing notes.  Pertinent labs & imaging results that were available during my care of the patient were reviewed by me and considered in my medical decision making (see chart for details).  MDM Reviewed: previous chart, nursing note and vitals Reviewed previous: labs and CT  scan Interpretation: labs, ECG and x-ray   Results for orders placed or performed during the hospital encounter of 03/26/17  CBC  Result Value Ref Range   WBC 9.6 4.0 - 10.5 K/uL   RBC 4.26 3.87 - 5.11 MIL/uL   Hemoglobin 12.1 12.0 - 15.0 g/dL   HCT 37.8 36.0 - 46.0 %   MCV 88.7 78.0 - 100.0 fL   MCH 28.4 26.0 - 34.0 pg   MCHC 32.0 30.0 - 36.0 g/dL   RDW 14.1 11.5 - 15.5 %   Platelets 381 150 - 400 K/uL  Comprehensive metabolic panel  Result Value Ref Range   Sodium 141 135 - 145 mmol/L   Potassium 3.8 3.5 - 5.1 mmol/L   Chloride 103 101 - 111 mmol/L   CO2 28 22 - 32 mmol/L   Glucose, Bld 96 65 - 99 mg/dL   BUN 14 6 - 20 mg/dL   Creatinine, Ser 0.63 0.44 - 1.00 mg/dL   Calcium 9.5 8.9 - 10.3 mg/dL   Total Protein 7.8 6.5 - 8.1 g/dL   Albumin 4.0 3.5 - 5.0 g/dL   AST 39 15 - 41 U/L   ALT 41 14 - 54 U/L   Alkaline Phosphatase 117 38 - 126 U/L   Total Bilirubin 1.1 0.3 - 1.2 mg/dL   GFR calc non Af Amer >60 >60 mL/min   GFR calc Af Amer >60 >60 mL/min   Anion gap 10 5 - 15  Troponin I  Result Value Ref Range   Troponin I <0.03 <0.03 ng/mL  Lipase, blood  Result Value Ref Range   Lipase 27 11 - 51 U/L   Dg Chest 2 View Result Date: 03/26/2017 CLINICAL DATA:  Rt sided chest pain, radiates to rt side of neck, intermittent x 2 days, sob with exertion. Dizziness, nausea x 5 days. History of DM, HTN. EXAM: CHEST  2 VIEW COMPARISON:  None. FINDINGS: Normal mediastinum and cardiac silhouette. Normal pulmonary vasculature. No evidence of effusion, infiltrate, or pneumothorax. No acute bony abnormality. IMPRESSION: Normal chest radiograph. Electronically Signed   By: Suzy Bouchard M.D.   On: 03/26/2017 16:06     2115:  Pt in ED today with multiple symptoms for the past 5 days. Pt seen at [facility] ED last night with reassuring workup (labs, CT head, EKG, CXR). Pt states she LWBS by ED MD. Pt's workup today is also reassuring; no need to repeat CT-H.  Offered to tx  nausea and vertigo  symptoms; pt insistent "something else is wrong." Udip/preg and d-dimer pending. Again reassured pt regarding her testing results yesterday and today. Pt insistent her symptoms are due to her new BP med. Lengthy d/w pt regarding need to not "anchor" on such dx, but broaden DDx to include more emergent medical conditions. Pt now does not want to stay for any further dx testing or treatment. Pt wants to leave now.  Pt and family informed re: dx testing results thus far, and that I recommend staying in the ED for further evaluation and treatment.  Pt refuses.  I encouraged pt to stay, continues to refuse.  Pt makes her own medical decisions.  Risks of AMA explained to pt and family, including, but not limited to:  PE, stroke, heart attack, cardiac arrythmia ("irregular heart rate/beat"), "passing out," temporary and/or permanent disability, death.  Pt and family verb understanding and continue to refuse to stay in the ED, understanding the consequences of their decision.  I encouraged pt to follow up with her PMD tomorrow and return to the ED immediately if symptoms worsen, or for any other concerns.  Pt and family verb understanding, agreeable.      Final Clinical Impressions(s) / ED Diagnoses   Final diagnoses:  None    New Prescriptions New Prescriptions   No medications on file     Francine Graven, DO 03/31/17 [id number]

## 2017-03-26 NOTE — ED Notes (Signed)
Pt states chest pain feels worse. Pt vitals re-checked.

## 2017-03-27 DIAGNOSIS — M25562 Pain in left knee: Secondary | ICD-10-CM | POA: Diagnosis not present

## 2017-03-27 DIAGNOSIS — M79642 Pain in left hand: Secondary | ICD-10-CM | POA: Diagnosis not present

## 2017-03-27 DIAGNOSIS — M79641 Pain in right hand: Secondary | ICD-10-CM | POA: Diagnosis not present

## 2017-03-27 DIAGNOSIS — M25561 Pain in right knee: Secondary | ICD-10-CM | POA: Diagnosis not present

## 2017-03-27 DIAGNOSIS — M25542 Pain in joints of left hand: Secondary | ICD-10-CM | POA: Diagnosis not present

## 2017-03-27 DIAGNOSIS — M25541 Pain in joints of right hand: Secondary | ICD-10-CM | POA: Diagnosis not present

## 2017-03-28 DIAGNOSIS — D500 Iron deficiency anemia secondary to blood loss (chronic): Secondary | ICD-10-CM | POA: Diagnosis not present

## 2017-03-28 DIAGNOSIS — M2550 Pain in unspecified joint: Secondary | ICD-10-CM | POA: Diagnosis not present

## 2017-03-29 LAB — METANEPHRINES, PLASMA
Metanephrine, Free: <10 pg/mL (ref 0–62)
Normetanephrine, Free: 55 pg/mL (ref 0–145)

## 2017-03-31 ENCOUNTER — Encounter: Payer: Self-pay | Admitting: Family Medicine

## 2017-04-03 ENCOUNTER — Encounter: Payer: Self-pay | Admitting: Family Medicine

## 2017-04-10 DIAGNOSIS — M2550 Pain in unspecified joint: Secondary | ICD-10-CM | POA: Diagnosis not present

## 2017-04-10 DIAGNOSIS — X501XXA Overexertion from prolonged static or awkward postures, initial encounter: Secondary | ICD-10-CM | POA: Diagnosis not present

## 2017-04-10 DIAGNOSIS — D500 Iron deficiency anemia secondary to blood loss (chronic): Secondary | ICD-10-CM | POA: Diagnosis not present

## 2017-04-10 DIAGNOSIS — S6982XA Other specified injuries of left wrist, hand and finger(s), initial encounter: Secondary | ICD-10-CM | POA: Diagnosis not present

## 2017-04-10 DIAGNOSIS — D649 Anemia, unspecified: Secondary | ICD-10-CM | POA: Diagnosis not present

## 2017-04-14 ENCOUNTER — Ambulatory Visit: Payer: BLUE CROSS/BLUE SHIELD | Admitting: Family Medicine

## 2017-04-18 ENCOUNTER — Ambulatory Visit (INDEPENDENT_AMBULATORY_CARE_PROVIDER_SITE_OTHER): Payer: BLUE CROSS/BLUE SHIELD | Admitting: Family Medicine

## 2017-04-18 ENCOUNTER — Encounter: Payer: Self-pay | Admitting: Family Medicine

## 2017-04-18 VITALS — BP 140/90 | Ht 62.0 in | Wt 146.1 lb

## 2017-04-18 DIAGNOSIS — L739 Follicular disorder, unspecified: Secondary | ICD-10-CM | POA: Diagnosis not present

## 2017-04-18 DIAGNOSIS — I10 Essential (primary) hypertension: Secondary | ICD-10-CM | POA: Diagnosis not present

## 2017-04-18 DIAGNOSIS — B354 Tinea corporis: Secondary | ICD-10-CM | POA: Diagnosis not present

## 2017-04-18 DIAGNOSIS — D1801 Hemangioma of skin and subcutaneous tissue: Secondary | ICD-10-CM | POA: Diagnosis not present

## 2017-04-18 DIAGNOSIS — B353 Tinea pedis: Secondary | ICD-10-CM | POA: Diagnosis not present

## 2017-04-18 MED ORDER — METOPROLOL SUCCINATE ER 50 MG PO TB24: 50.0000 mg | ORAL_TABLET | Freq: Every day | ORAL | 5 refills | Status: DC

## 2017-04-18 NOTE — Progress Notes (Signed)
   Subjective:    Patient ID: Samantha Moreno, female    DOB: 1971/03/15, 46 y.o.   MRN: [id number]  Hypertension  This is a chronic problem. The current episode started more than 1 month ago.    Patient has concerns of adverse reaction to amlodipine. Patient has discontinued medication due to chest pain, and dizziness.  She has a history of high blood pressure she had side effects with amlodipine she is concerned about the possibility of underlying issues because of that patient did see cardiology last year had thorough workup including cardiac score which was 0 Review of Systems Patient gets intermittent sharp chest pains sometimes feels a little woozy denies nausea vomiting diarrhea denies sweats chills fevers    Objective:   Physical Exam Lungs clear no respiratory distress respiratory rate is normal no crackles heart regular no murmurs pulse normal BP 142/90 2/94       Assessment & Plan:  HTN subpar control Did not tolerate amlodipine side effects We will go ahead up on metoprolol patient will send [place] blood pressure readings within the next 7-14 days if not doing adequate enough there consider low-dose diuretic I truly doubt that her intermittent chest pains are cardiac given her cardiac score was 0 Patient highly encouraged to stay physically active watch diet and repeat her cholesterol profile by early spring if it is not dramatically better by that point the next step would be is starting a statin

## 2017-04-21 DIAGNOSIS — M2550 Pain in unspecified joint: Secondary | ICD-10-CM | POA: Diagnosis not present

## 2017-04-21 DIAGNOSIS — D500 Iron deficiency anemia secondary to blood loss (chronic): Secondary | ICD-10-CM | POA: Diagnosis not present

## 2017-04-22 DIAGNOSIS — S63392A Traumatic rupture of other ligament of left wrist, initial encounter: Secondary | ICD-10-CM | POA: Diagnosis not present

## 2017-04-22 DIAGNOSIS — M25532 Pain in left wrist: Secondary | ICD-10-CM | POA: Diagnosis not present

## 2017-04-22 DIAGNOSIS — X58XXXA Exposure to other specified factors, initial encounter: Secondary | ICD-10-CM | POA: Diagnosis not present

## 2017-04-22 DIAGNOSIS — S63592A Other specified sprain of left wrist, initial encounter: Secondary | ICD-10-CM | POA: Diagnosis not present

## 2017-04-25 DIAGNOSIS — I10 Essential (primary) hypertension: Secondary | ICD-10-CM | POA: Diagnosis not present

## 2017-04-25 DIAGNOSIS — M654 Radial styloid tenosynovitis [de Quervain]: Secondary | ICD-10-CM | POA: Diagnosis not present

## 2017-04-25 DIAGNOSIS — S638X2D Sprain of other part of left wrist and hand, subsequent encounter: Secondary | ICD-10-CM | POA: Diagnosis not present

## 2017-04-25 DIAGNOSIS — D509 Iron deficiency anemia, unspecified: Secondary | ICD-10-CM | POA: Diagnosis not present

## 2017-04-25 DIAGNOSIS — M2550 Pain in unspecified joint: Secondary | ICD-10-CM | POA: Diagnosis not present

## 2017-04-25 DIAGNOSIS — S63592D Other specified sprain of left wrist, subsequent encounter: Secondary | ICD-10-CM | POA: Diagnosis not present

## 2017-04-25 DIAGNOSIS — E559 Vitamin D deficiency, unspecified: Secondary | ICD-10-CM | POA: Diagnosis not present

## 2017-05-05 ENCOUNTER — Ambulatory Visit: Payer: BLUE CROSS/BLUE SHIELD | Admitting: Family Medicine

## 2017-05-06 DIAGNOSIS — M654 Radial styloid tenosynovitis [de Quervain]: Secondary | ICD-10-CM | POA: Diagnosis not present

## 2017-05-08 DIAGNOSIS — M25532 Pain in left wrist: Secondary | ICD-10-CM | POA: Diagnosis not present

## 2017-05-08 DIAGNOSIS — X58XXXA Exposure to other specified factors, initial encounter: Secondary | ICD-10-CM | POA: Diagnosis not present

## 2017-05-08 DIAGNOSIS — G8918 Other acute postprocedural pain: Secondary | ICD-10-CM | POA: Diagnosis not present

## 2017-05-08 DIAGNOSIS — S63592A Other specified sprain of left wrist, initial encounter: Secondary | ICD-10-CM | POA: Diagnosis not present

## 2017-05-08 DIAGNOSIS — M654 Radial styloid tenosynovitis [de Quervain]: Secondary | ICD-10-CM | POA: Diagnosis not present

## 2017-05-16 ENCOUNTER — Ambulatory Visit: Payer: BLUE CROSS/BLUE SHIELD | Admitting: Nurse Practitioner

## 2017-05-23 DIAGNOSIS — M797 Fibromyalgia: Secondary | ICD-10-CM | POA: Diagnosis not present

## 2017-05-23 DIAGNOSIS — M79641 Pain in right hand: Secondary | ICD-10-CM | POA: Diagnosis not present

## 2017-05-23 DIAGNOSIS — M79642 Pain in left hand: Secondary | ICD-10-CM | POA: Diagnosis not present

## 2017-05-23 DIAGNOSIS — M2550 Pain in unspecified joint: Secondary | ICD-10-CM | POA: Diagnosis not present

## 2017-08-21 DIAGNOSIS — E611 Iron deficiency: Secondary | ICD-10-CM | POA: Diagnosis not present

## 2017-08-21 DIAGNOSIS — D500 Iron deficiency anemia secondary to blood loss (chronic): Secondary | ICD-10-CM | POA: Diagnosis not present

## 2017-08-21 DIAGNOSIS — M2550 Pain in unspecified joint: Secondary | ICD-10-CM | POA: Diagnosis not present

## 2017-08-22 ENCOUNTER — Encounter: Payer: Self-pay | Admitting: Family Medicine

## 2017-08-22 NOTE — Telephone Encounter (Signed)
Nurses-please contact with the patient because white blood count was up patient having sore throat and swelling in the throat she ought to go ahead and be seen in urgent care center- I dont rec waiing till [employer]

## 2017-08-27 NOTE — Telephone Encounter (Signed)
Left message to return call 

## 2017-08-27 NOTE — Telephone Encounter (Signed)
Nurses-please make sure that the patient is connected regarding her issue if she is having ongoing trouble I recommend being seen here or ER/urgent care

## 2017-08-27 NOTE — Telephone Encounter (Signed)
Called pt she states she saw the urgent care but is feeling worse. Told her it was a sinus infection. Prescribed augmentin and she is taking mucinex. No difficulty breathing. Offered pt appt for tomorrow. No availabilty today. She did want appt. Transferred to front to schedule.

## 2017-08-28 ENCOUNTER — Encounter: Payer: Self-pay | Admitting: Family Medicine

## 2017-08-28 ENCOUNTER — Ambulatory Visit: Payer: BLUE CROSS/BLUE SHIELD | Admitting: Family Medicine

## 2017-08-28 VITALS — BP 148/78 | Temp 98.5°F | Ht 62.0 in | Wt 142.0 lb

## 2017-08-28 DIAGNOSIS — J019 Acute sinusitis, unspecified: Secondary | ICD-10-CM | POA: Diagnosis not present

## 2017-08-28 DIAGNOSIS — B9689 Other specified bacterial agents as the cause of diseases classified elsewhere: Secondary | ICD-10-CM | POA: Diagnosis not present

## 2017-08-28 MED ORDER — LEVOFLOXACIN 500 MG PO TABS: 500.0000 mg | ORAL_TABLET | Freq: Every day | ORAL | 0 refills | Status: DC

## 2017-08-28 MED ORDER — HYDROCODONE-HOMATROPINE 5-1.5 MG/5ML PO SYRP: 5.0000 mL | ORAL_SOLUTION | Freq: Four times a day (QID) | ORAL | 0 refills | Status: AC | PRN

## 2017-08-28 NOTE — Patient Instructions (Signed)
Please call in summer for labs and follow up on cholesterol and sugar

## 2017-08-28 NOTE — Progress Notes (Signed)
   Subjective:    Patient ID: Samantha Moreno, female    DOB: 1970-07-16, 47 y.o.   MRN: [id number]  Sinusitis  This is a new problem. The current episode started in the past 7 days. Associated symptoms include chills, congestion, coughing, ear pain, headaches and a sore throat. Pertinent negatives include no shortness of breath. (Vomiting, diarrhea, runny nose, nausea, upper back ache, chest tightness) Past treatments include antibiotics (advil, mucinex, benadryl). The treatment provided mild relief.   Pt went to Urgent Care in [place] on Friday March 15 and was prescribed Amoxicillin    Patient with significant amount of head congestion drainage coughing chest congestion as well denies high fevers does relate some chills fatigue also relates chest congestion head congestion drainage coughing PMH benign try the amoxicillin that did not really seem to help much in fact it was causing diarrhea more than likely urgent care prescribed her Augmentin Review of Systems  Constitutional: Positive for chills and fatigue. Negative for activity change and fever.  HENT: Positive for congestion, ear pain, rhinorrhea and sore throat.   Eyes: Negative for discharge.  Respiratory: Positive for cough. Negative for shortness of breath and wheezing.   Cardiovascular: Negative for chest pain.  Gastrointestinal: Positive for nausea and vomiting.  Musculoskeletal: Positive for myalgias.  Neurological: Positive for headaches.       Objective:   Physical Exam  Constitutional: She appears well-developed.  HENT:  Head: Normocephalic.  Right Ear: External ear normal.  Left Ear: External ear normal.  Nose: Nose normal.  Mouth/Throat: Oropharynx is clear and moist. No oropharyngeal exudate.  Eyes: Right eye exhibits no discharge. Left eye exhibits no discharge.  Neck: Neck supple. No tracheal deviation present.  Cardiovascular: Normal rate and normal heart sounds.  No murmur heard. Pulmonary/Chest: Effort  normal and breath sounds normal. She has no wheezes. She has no rales.  Lymphadenopathy:    She has no cervical adenopathy.  Skin: Skin is warm and dry.  Nursing note and vitals reviewed.         Assessment & Plan:  Viral syndrome Secondary bronchitis Secondary sinusitis Antibiotics prescribed warning signs discussed X-rays lab work not indicated If progressive troubles or problems follow-up  Lab work later in the summer regarding cholesterol and sugar with follow-up visit

## 2017-08-29 ENCOUNTER — Other Ambulatory Visit: Payer: Self-pay | Admitting: Family Medicine

## 2017-08-29 ENCOUNTER — Telehealth: Payer: Self-pay | Admitting: Family Medicine

## 2017-08-29 MED ORDER — CEFDINIR 300 MG PO CAPS: ORAL_CAPSULE | ORAL | 0 refills | Status: DC

## 2017-08-29 NOTE — Telephone Encounter (Signed)
I would recommend Omnicef 300 mg 1 twice daily for 10 days, patient is not allergic to cephalosporins

## 2017-08-29 NOTE — Telephone Encounter (Signed)
Pt called and stated that she took the Levaquin that was prescribed yesterday but about 2 hours later her eyes began twitching and her hands/wrist were itchy and it was really hard to sleep. Nurse asked if she was allergic to augmentin and pt stated no that it was harsh on her stomach; pt stated that she could probably tolerate the Augmentin  or could she take regular amoxicillin. Please advise. Thanks

## 2017-08-29 NOTE — Telephone Encounter (Signed)
Pt notified med sent into [facility] per patient request

## 2017-09-21 ENCOUNTER — Other Ambulatory Visit: Payer: Self-pay

## 2017-09-21 ENCOUNTER — Emergency Department (HOSPITAL_COMMUNITY)
Admission: EM | Admit: 2017-09-21 | Discharge: 2017-09-21 | Disposition: A | Discharge status: Home / Self Care | Payer: BLUE CROSS/BLUE SHIELD | Attending: Emergency Medicine | Admitting: Emergency Medicine

## 2017-09-21 ENCOUNTER — Emergency Department (HOSPITAL_COMMUNITY): Payer: BLUE CROSS/BLUE SHIELD

## 2017-09-21 ENCOUNTER — Encounter (HOSPITAL_COMMUNITY): Payer: Self-pay | Admitting: Emergency Medicine

## 2017-09-21 DIAGNOSIS — N201 Calculus of ureter: Secondary | ICD-10-CM | POA: Insufficient documentation

## 2017-09-21 DIAGNOSIS — I10 Essential (primary) hypertension: Secondary | ICD-10-CM | POA: Diagnosis not present

## 2017-09-21 DIAGNOSIS — Z87891 Personal history of nicotine dependence: Secondary | ICD-10-CM | POA: Diagnosis not present

## 2017-09-21 DIAGNOSIS — N23 Unspecified renal colic: Secondary | ICD-10-CM

## 2017-09-21 DIAGNOSIS — R1111 Vomiting without nausea: Secondary | ICD-10-CM | POA: Diagnosis not present

## 2017-09-21 DIAGNOSIS — Z79899 Other long term (current) drug therapy: Secondary | ICD-10-CM | POA: Insufficient documentation

## 2017-09-21 DIAGNOSIS — N368 Other specified disorders of urethra: Secondary | ICD-10-CM | POA: Diagnosis not present

## 2017-09-21 DIAGNOSIS — R109 Unspecified abdominal pain: Secondary | ICD-10-CM | POA: Diagnosis not present

## 2017-09-21 DIAGNOSIS — R1084 Generalized abdominal pain: Secondary | ICD-10-CM | POA: Diagnosis not present

## 2017-09-21 DIAGNOSIS — R112 Nausea with vomiting, unspecified: Secondary | ICD-10-CM | POA: Diagnosis not present

## 2017-09-21 LAB — BASIC METABOLIC PANEL
ANION GAP: 13 (ref 5–15)
BUN: 13 mg/dL (ref 6–20)
CALCIUM: 9.2 mg/dL (ref 8.9–10.3)
CHLORIDE: 104 mmol/L (ref 101–111)
CO2: 22 mmol/L (ref 22–32)
Creatinine, Ser: 0.73 mg/dL (ref 0.44–1.00)
GFR calc Af Amer: >60 mL/min (ref >60)
GFR calc non Af Amer: >60 mL/min (ref >60)
GLUCOSE: 166 mg/dL — AB (ref 65–99)
Potassium: 3.5 mmol/L (ref 3.5–5.1)
Sodium: 139 mmol/L (ref 135–145)

## 2017-09-21 LAB — URINALYSIS, ROUTINE W REFLEX MICROSCOPIC
BILIRUBIN URINE: NEGATIVE
Bacteria, UA: NONE SEEN
GLUCOSE, UA: NEGATIVE mg/dL
KETONES UR: 20 mg/dL — AB
LEUKOCYTES UA: NEGATIVE
Nitrite: NEGATIVE
PH: 5 (ref 5.0–8.0)
Protein, ur: 30 mg/dL — AB
Specific Gravity, Urine: 1.021 (ref 1.005–1.030)

## 2017-09-21 LAB — PREGNANCY, URINE: PREG TEST UR: NEGATIVE

## 2017-09-21 LAB — CBC
HCT: 40.4 % (ref 36.0–46.0)
HEMOGLOBIN: 13 g/dL (ref 12.0–15.0)
MCH: 28.6 pg (ref 26.0–34.0)
MCHC: 32.2 g/dL (ref 30.0–36.0)
MCV: 88.8 fL (ref 78.0–100.0)
Platelets: 321 10*3/uL (ref 150–400)
RBC: 4.55 MIL/uL (ref 3.87–5.11)
RDW: 13.4 % (ref 11.5–15.5)
WBC: 9.9 10*3/uL (ref 4.0–10.5)

## 2017-09-21 MED ORDER — HYDROCODONE-ACETAMINOPHEN 5-325 MG PO TABS: ORAL_TABLET | ORAL | 0 refills | Status: DC

## 2017-09-21 MED ORDER — KETOROLAC TROMETHAMINE 30 MG/ML IJ SOLN
30.0000 mg | Freq: Once | INTRAMUSCULAR | Status: AC
  Administered 2017-09-21: 30 mg via INTRAVENOUS
  Filled 2017-09-21: qty 1

## 2017-09-21 MED ORDER — ONDANSETRON 4 MG PO TBDP: 4.0000 mg | ORAL_TABLET | Freq: Three times a day (TID) | ORAL | 0 refills | Status: DC | PRN

## 2017-09-21 MED ORDER — FENTANYL CITRATE (PF) 100 MCG/2ML IJ SOLN
50.0000 ug | Freq: Once | INTRAMUSCULAR | Status: AC
  Administered 2017-09-21: 50 ug via INTRAVENOUS
  Filled 2017-09-21: qty 2

## 2017-09-21 MED ORDER — TAMSULOSIN HCL 0.4 MG PO CAPS: 0.4000 mg | ORAL_CAPSULE | Freq: Every day | ORAL | 0 refills | Status: DC

## 2017-09-21 MED ORDER — ONDANSETRON HCL 4 MG/2ML IJ SOLN
4.0000 mg | Freq: Once | INTRAMUSCULAR | Status: AC
  Administered 2017-09-21: 4 mg via INTRAVENOUS
  Filled 2017-09-21: qty 2

## 2017-09-21 MED ORDER — NAPROXEN 250 MG PO TABS: 250.0000 mg | ORAL_TABLET | Freq: Two times a day (BID) | ORAL | 0 refills | Status: DC

## 2017-09-21 NOTE — ED Triage Notes (Signed)
Patient c/o right flank pain x1 week that has progressively gotten worse. Per patient pain radiates from flank into right lower abd and down right leg. Nausea and vomiting this morning with some diarrhea. Denies any urinary symptoms or fevers. No hx of kidney stones.

## 2017-09-21 NOTE — ED Provider Notes (Signed)
[facility] EMERGENCY DEPARTMENT Provider Note   CSN: [id number] Arrival date & time: 09/21/17  1035     History   Chief Complaint Chief Complaint  Patient presents with  . Flank Pain    HPI Samantha Moreno is a 47 y.o. female.  HPI Pt was seen at 1150. Per pt, c/o sudden onset and persistence of worsening right sided flank "pain" over the past 1 week, worse since this morning. Pt describes the pain as "stabbing," and radiating into the right side of her abd.  Has been associated with multiple intermittent episodes of N/V and "some diarrhea" today.  Denies vaginal bleeding/discharge, no dysuria/hematuria, no abd pain, no black or blood in stools or emesis, no CP/SOB, no fevers, no rash.     Past Medical History:  Diagnosis Date  . Gestational diabetes   . Headache   . HTN (hypertension)     Patient Active Problem List   Diagnosis Date Noted  . Dyslipidemia 03/24/2017  . LLQ pain 11/28/2016  . Nausea with vomiting 11/28/2016  . Diarrhea 11/28/2016  . Perimenopause 11/17/2016  . Iron deficiency anemia 10/28/2016  . Vitamin D deficiency 10/28/2016  . Essential hypertension 10/26/2016  . Bereavement 10/26/2016  . Other chest pain 04/24/2016  . Hyperlipidemia 03/21/2016  . Fasting hyperglycemia 03/21/2016    Past Surgical History:  Procedure Laterality Date  . COLONOSCOPY N/A 11/22/2016   Dr. Gala Romney: Distal 5 cm of terminal ileum normal. Diverticulosis in the sigmoid colon and descending colon.  . ESOPHAGOGASTRODUODENOSCOPY N/A 11/22/2016   Dr. Gala Romney: H. pylori gastritis.  Marland Kitchen exploratory laparoscopy  1992/1993   abdominal pain after second child   . GIVENS CAPSULE STUDY N/A 11/26/2016   Procedure: GIVENS CAPSULE STUDY;  Surgeon: Daneil Dolin, MD;  Location: [facility] SUITE;  Service: Endoscopy;  Laterality: N/A;  700     OB History    Gravida  4   Para  3   Term  3   Preterm      AB  1   Living  3     SAB  1   TAB      Ectopic      Multiple     Live Births               Home Medications    Prior to Admission medications   Medication Sig Start Date End Date Taking? Authorizing Provider  cefdinir (OMNICEF) 300 MG capsule Take one capsule twice daily for 10 days 08/29/17   Kathyrn Drown, MD  cyclobenzaprine (FLEXERIL) 5 MG tablet 1 qhs prn 03/24/17   Luking, Scott A, MD  ferrous sulfate 325 (65 FE) MG tablet Take 325 mg by mouth 2 (two) times daily.    [provider]  HYDROcodone-acetaminophen (NORCO/VICODIN) 5-325 MG tablet Take 1 tablet by mouth every 6 (six) hours as needed for moderate pain or severe pain. Patient not taking: Reported on 08/28/2017 03/24/17   Kathyrn Drown, MD  ibuprofen (ADVIL,MOTRIN) 200 MG tablet Take 200 mg by mouth every 6 (six) hours as needed.    [provider]  metoprolol succinate (TOPROL-XL) 50 MG 24 hr tablet Take 1 tablet (50 mg total) daily by mouth. 04/18/17   Kathyrn Drown, MD  nitroGLYCERIN (NITROSTAT) 0.4 MG SL tablet Place 1 tablet under the tongue every 5 (five) minutes as needed for chest pain.  11/06/16   [provider]  pantoprazole (PROTONIX) 40 MG tablet Take 1 tablet (40 mg  total) by mouth daily. Patient not taking: Reported on 03/24/2017 12/03/16   Mahala Menghini, PA-C  promethazine (PHENERGAN) 25 MG tablet Take 1/2 to 1 tablet every four hours as needed for nausea/vomiting 03/25/17   Kathyrn Drown, MD  Vitamin D, Ergocalciferol, (DRISDOL) 50000 units CAPS capsule TAKE 1 CAPSULE BY MOUTH ONCE A WEEK 01/31/17   Kathyrn Drown, MD    Family History Family History  Problem Relation Age of Onset  . Stroke Mother   . Heart disease Mother   . Stroke Father   . Diabetes Father   . Hypertension Father   . Prostate cancer Father   . Heart attack Sister   . Colon cancer Neg Hx   . Colon polyps Neg Hx     Social History Social History   Tobacco Use  . Smoking status: Former Smoker    Types: Cigarettes    Last attempt to quit: 03/20/1991     Years since quitting: 26.5  . Smokeless tobacco: Never Used  . Tobacco comment: smoke for 2 years around age 16  Substance Use Topics  . Alcohol use: Yes    Comment: occasionally  . Drug use: No     Allergies   Levaquin [levofloxacin in d5w]; Amlodipine; and Augmentin [amoxicillin-pot clavulanate]   Review of Systems Review of Systems ROS: Statement: All systems negative except as marked or noted in the HPI; Constitutional: Negative for fever and chills. ; ; Eyes: Negative for eye pain, redness and discharge. ; ; ENMT: Negative for ear pain, hoarseness, nasal congestion, sinus pressure and sore throat. ; ; Cardiovascular: Negative for chest pain, palpitations, diaphoresis, dyspnea and peripheral edema. ; ; Respiratory: Negative for cough, wheezing and stridor. ; ; Gastrointestinal: +N/V/D. Negative for abdominal pain, blood in stool, hematemesis, jaundice and rectal bleeding. . ; ; Genitourinary: +flank pain. Negative for dysuria, and hematuria. ; ; GYN:  No pelvic pain, no vaginal bleeding, no vaginal discharge, no vulvar pain. ;; Musculoskeletal: Negative for neck pain. Negative for swelling and trauma.; ; Skin: Negative for pruritus, rash, abrasions, blisters, bruising and skin lesion.; ; Neuro: Negative for headache, lightheadedness and neck stiffness. Negative for weakness, altered level of consciousness, altered mental status, extremity weakness, paresthesias, involuntary movement, seizure and syncope.       Physical Exam Updated Vital Signs BP (!) 181/101 (BP Location: Left Arm)   Pulse 97   Temp 97.8 F (36.6 C) (Oral)   Resp 16   Ht 5\' 2"  (1.575 m)   Wt 64.4 kg (142 lb)   LMP 09/07/2017   SpO2 100%   BMI 25.97 kg/m    Patient Vitals for the past 24 hrs:  BP Temp Temp src Pulse Resp SpO2 Height Weight  09/21/17 1400 118/82 - - (!) 102 20 99 % - -  09/21/17 1330 139/84 - - (!) 115 17 97 % - -  09/21/17 1312 (!) 149/91 - - 100 19 100 % - -  09/21/17 1200 (!) 159/90 - -  80 16 99 % - -  09/21/17 1104 (!) 181/101 97.8 F (36.6 C) Oral 97 16 100 % - -  09/21/17 1101 - - - - - - 5\' 2"  (1.575 m) 64.4 kg (142 lb)      Physical Exam 1155: Physical examination:  Nursing notes reviewed; Vital signs and O2 SAT reviewed;  Constitutional: Well developed, Well nourished, Well hydrated, Uncomfortable appearing.; Head:  Normocephalic, atraumatic; Eyes: EOMI, PERRL, No scleral icterus; ENMT: Mouth and pharynx normal,  Mucous membranes moist; Neck: Supple, Full range of motion, No lymphadenopathy; Cardiovascular: Regular rate and rhythm, No gallop; Respiratory: Breath sounds clear & equal bilaterally, No wheezes.  Speaking full sentences with ease, Normal respiratory effort/excursion; Chest: Nontender, Movement normal; Abdomen: Soft, Nontender, Nondistended, Normal bowel sounds; Genitourinary: No CVA tenderness; Spine:  No midline CS, TS, LS tenderness. +TTP right lumbar paraspinal muscles.;; Extremities: Peripheral pulses normal, No tenderness, No edema, No calf edema or asymmetry.; Neuro: AA&Ox3, Major CN grossly intact.  Speech clear. No gross focal motor or sensory deficits in extremities.; Skin: Color normal, Warm, Dry.   ED Treatments / Results  Labs (all labs ordered are listed, but only abnormal results are displayed)   EKG None  Radiology   Procedures Procedures (including critical care time)  Medications Ordered in ED Medications  ondansetron (ZOFRAN) injection 4 mg (4 mg Intravenous Given 09/21/17 1147)  fentaNYL (SUBLIMAZE) injection 50 mcg (50 mcg Intravenous Given 09/21/17 1147)     Initial Impression / Assessment and Plan / ED Course  I have reviewed the triage vital signs and the nursing notes.  Pertinent labs & imaging results that were available during my care of the patient were reviewed by me and considered in my medical decision making (see chart for details).  MDM Reviewed: previous chart, nursing note and vitals Reviewed previous:  labs Interpretation: labs and CT scan   Results for orders placed or performed during the hospital encounter of 09/21/17  Urinalysis, Routine w reflex microscopic- may I&O cath if menses  Result Value Ref Range   Color, Urine YELLOW YELLOW   APPearance HAZY (A) CLEAR   Specific Gravity, Urine 1.021 1.005 - 1.030   pH 5.0 5.0 - 8.0   Glucose, UA NEGATIVE NEGATIVE mg/dL   Hgb urine dipstick LARGE (A) NEGATIVE   Bilirubin Urine NEGATIVE NEGATIVE   Ketones, ur 20 (A) NEGATIVE mg/dL   Protein, ur 30 (A) NEGATIVE mg/dL   Nitrite NEGATIVE NEGATIVE   Leukocytes, UA NEGATIVE NEGATIVE   RBC / HPF TOO NUMEROUS TO COUNT 0 - 5 RBC/hpf   WBC, UA 6-30 0 - 5 WBC/hpf   Bacteria, UA NONE SEEN NONE SEEN   Squamous Epithelial / LPF 0-5 (A) NONE SEEN   Mucus PRESENT   Basic metabolic panel  Result Value Ref Range   Sodium 139 135 - 145 mmol/L   Potassium 3.5 3.5 - 5.1 mmol/L   Chloride 104 101 - 111 mmol/L   CO2 22 22 - 32 mmol/L   Glucose, Bld 166 (H) 65 - 99 mg/dL   BUN 13 6 - 20 mg/dL   Creatinine, Ser 0.73 0.44 - 1.00 mg/dL   Calcium 9.2 8.9 - 10.3 mg/dL   GFR calc non Af Amer >60 >60 mL/min   GFR calc Af Amer >60 >60 mL/min   Anion gap 13 5 - 15  CBC  Result Value Ref Range   WBC 9.9 4.0 - 10.5 K/uL   RBC 4.55 3.87 - 5.11 MIL/uL   Hemoglobin 13.0 12.0 - 15.0 g/dL   HCT 40.4 36.0 - 46.0 %   MCV 88.8 78.0 - 100.0 fL   MCH 28.6 26.0 - 34.0 pg   MCHC 32.2 30.0 - 36.0 g/dL   RDW 13.4 11.5 - 15.5 %   Platelets 321 150 - 400 K/uL  Pregnancy, urine  Result Value Ref Range   Preg Test, Ur NEGATIVE NEGATIVE   Ct Renal Stone Study Result Date: 09/21/2017 CLINICAL DATA:  Worsening right flank pain  for 1 week. Nausea and vomiting. EXAM: CT ABDOMEN AND PELVIS WITHOUT CONTRAST TECHNIQUE: Multidetector CT imaging of the abdomen and pelvis was performed following the standard protocol without IV contrast. COMPARISON:  11/28/2016 FINDINGS: Lower chest: No acute findings. Hepatobiliary: No mass  visualized on this unenhanced exam. Mild to moderate diffuse hepatic steatosis. Gallbladder is unremarkable. Pancreas: No mass or inflammatory process visualized on this unenhanced exam. Spleen:  Within normal limits in size. Adrenals/Urinary tract: Tiny 1-2 mm calculus in the midpole of the right kidney. Mild right hydroureteronephrosis is seen due to a 3 mm calculus at the right ureterovesical junction. Stomach/Bowel: No evidence of obstruction, inflammatory process, or abnormal fluid collections. Normal appendix visualized. Vascular/Lymphatic: No pathologically enlarged lymph nodes identified. No evidence of abdominal aortic aneurysm. Reproductive:  No mass or other significant abnormality. Other:  None. Musculoskeletal:  No suspicious bone lesions identified. IMPRESSION: Mild right hydroureteronephrosis due to 3 mm calculus at the right ureterovesical junction. Tiny right intrarenal calculus. Hepatic steatosis. Electronically Signed   By: Earle Gell M.D.   On: 09/21/2017 12:50    1420:  Pt states she feels better and wants to go home now. Tx symptomatically at this time. Dx and testing d/w pt and family.  Questions answered.  Verb understanding, agreeable to d/c home with outpt f/u.    Final Clinical Impressions(s) / ED Diagnoses   Final diagnoses:  None    ED Discharge Orders    None       Francine Graven, DO 09/24/17 2052

## 2017-09-21 NOTE — Discharge Instructions (Signed)
Take the prescriptions as directed. Call your regular medical doctor and the Urologist tomorrow to schedule a follow up appointment this week.  Return to the Emergency Department immediately if worsening.

## 2017-09-21 NOTE — ED Notes (Signed)
Patient transported to CT 

## 2017-09-22 ENCOUNTER — Ambulatory Visit: Payer: BLUE CROSS/BLUE SHIELD | Admitting: Family Medicine

## 2017-09-22 ENCOUNTER — Encounter: Payer: Self-pay | Admitting: Family Medicine

## 2017-09-22 VITALS — BP 144/92 | Temp 98.5°F | Ht 62.0 in | Wt 142.8 lb

## 2017-09-22 DIAGNOSIS — R109 Unspecified abdominal pain: Secondary | ICD-10-CM

## 2017-09-22 DIAGNOSIS — N23 Unspecified renal colic: Secondary | ICD-10-CM

## 2017-09-22 LAB — POCT URINALYSIS DIPSTICK
PH UA: 5 (ref 5.0–8.0)
SPEC GRAV UA: 1.025 (ref 1.010–1.025)

## 2017-09-22 MED ORDER — OXYCODONE-ACETAMINOPHEN 5-325 MG PO TABS: ORAL_TABLET | ORAL | 0 refills | Status: DC

## 2017-09-22 MED ORDER — ONDANSETRON HCL 8 MG PO TABS: 8.0000 mg | ORAL_TABLET | Freq: Three times a day (TID) | ORAL | 0 refills | Status: DC | PRN

## 2017-09-22 MED ORDER — PROMETHAZINE HCL 25 MG PO TABS: ORAL_TABLET | ORAL | 2 refills | Status: DC

## 2017-09-22 MED ORDER — TAMSULOSIN HCL 0.4 MG PO CAPS: 0.4000 mg | ORAL_CAPSULE | Freq: Every day | ORAL | 0 refills | Status: DC

## 2017-09-22 NOTE — Patient Instructions (Signed)

## 2017-09-22 NOTE — Progress Notes (Signed)
   Subjective:    Patient ID: Samantha Moreno, female    DOB: 22-Apr-1971, 47 y.o.   MRN: [id number]  HPI Pt here for follow up from ER on September 21 2017.  Kidney pain, nausea, vomiting. Started one week ago, Has used Advil, naproxen, and hydrocodone. Other symptoms include diarrhea, headache and abdominal pain.   ER record reviewed in detail CAT scan reviewed in detail with the patient patient states right flank pain states hydrocodone is not helping for her pain relates moderate nausea.  Denies hematuria denies high fever chills sweats has never had a kidney stone before Results for orders placed or performed in visit on 09/22/17  POCT Urinalysis Dipstick  Result Value Ref Range   Color, UA     Clarity, UA     Glucose, UA     Bilirubin, UA     Ketones, UA     Spec Grav, UA 1.025 1.010 - 1.025   Blood, UA trace    pH, UA 5.0 5.0 - 8.0   Protein, UA     Urobilinogen, UA  0.2 or 1.0 E.U./dL   Nitrite, UA     Leukocytes, UA  Negative   Appearance     Odor      Review of Systems  Constitutional: Negative for activity change, fatigue and fever.  HENT: Negative for congestion and rhinorrhea.   Respiratory: Negative for cough, chest tightness and shortness of breath.   Cardiovascular: Negative for chest pain and leg swelling.  Gastrointestinal: Positive for nausea. Negative for abdominal pain and diarrhea.  Genitourinary: Positive for flank pain and frequency. Negative for difficulty urinating and dysuria.  Skin: Negative for color change.  Neurological: Negative for headaches.  Psychiatric/Behavioral: Negative for behavioral problems.   CT scan lab work ER notes were reviewed with the patient    Objective:   Physical Exam  Constitutional: She appears well-developed.  HENT:  Head: Normocephalic.  Right Ear: External ear normal.  Left Ear: External ear normal.  Nose: Nose normal.  Mouth/Throat: Oropharynx is clear and moist. No oropharyngeal exudate.  Eyes: Right eye  exhibits no discharge. Left eye exhibits no discharge.  Neck: Neck supple. No tracheal deviation present.  Cardiovascular: Normal rate and normal heart sounds.  No murmur heard. Pulmonary/Chest: Effort normal and breath sounds normal. She has no wheezes. She has no rales.  Abdominal: Soft. She exhibits no distension. There is tenderness.  Flank pain on the right side no guarding or rebound  Lymphadenopathy:    She has no cervical adenopathy.  Skin: Skin is warm and dry.  Nursing note and vitals reviewed.         Assessment & Plan:  Severe right renal colic urgent urology referral patient already been to ER already had CAT scan Flomax on a regular basis oxycodone for pain hopefully urology will be able to get her in relatively soon patient cautioned not to drive or work while on oxycodone

## 2017-09-23 ENCOUNTER — Other Ambulatory Visit: Payer: Self-pay | Admitting: Urology

## 2017-09-23 ENCOUNTER — Other Ambulatory Visit: Payer: Self-pay | Admitting: Family Medicine

## 2017-09-23 DIAGNOSIS — R109 Unspecified abdominal pain: Secondary | ICD-10-CM

## 2017-09-23 DIAGNOSIS — N201 Calculus of ureter: Secondary | ICD-10-CM | POA: Diagnosis not present

## 2017-09-23 DIAGNOSIS — R1084 Generalized abdominal pain: Secondary | ICD-10-CM | POA: Diagnosis not present

## 2017-09-23 LAB — URINE CULTURE: Culture: NO GROWTH

## 2017-09-23 NOTE — Progress Notes (Signed)
Placed urgent referral in Epic. [facility] stated they would look at earliest appt. Stated they would call patient.

## 2017-09-24 ENCOUNTER — Encounter (HOSPITAL_BASED_OUTPATIENT_CLINIC_OR_DEPARTMENT_OTHER): Payer: Self-pay | Admitting: Anesthesiology

## 2017-09-24 ENCOUNTER — Encounter (HOSPITAL_BASED_OUTPATIENT_CLINIC_OR_DEPARTMENT_OTHER): Admission: RE | Payer: Self-pay | Source: Ambulatory Visit

## 2017-09-24 ENCOUNTER — Ambulatory Visit (HOSPITAL_BASED_OUTPATIENT_CLINIC_OR_DEPARTMENT_OTHER): Admission: RE | Admit: 2017-09-24 | Payer: BLUE CROSS/BLUE SHIELD | Source: Ambulatory Visit | Admitting: Urology

## 2017-09-24 DIAGNOSIS — N201 Calculus of ureter: Secondary | ICD-10-CM | POA: Diagnosis not present

## 2017-09-24 DIAGNOSIS — R1084 Generalized abdominal pain: Secondary | ICD-10-CM | POA: Diagnosis not present

## 2017-09-24 SURGERY — CYSTOURETEROSCOPY, WITH RETROGRADE PYELOGRAM AND STENT INSERTION
Anesthesia: General | Laterality: Right

## 2017-09-24 NOTE — Anesthesia Preprocedure Evaluation (Deleted)
Anesthesia Evaluation    Reviewed: Allergy & Precautions, Patient's Chart, lab work & pertinent test results  Airway        Dental   Pulmonary neg pulmonary ROS, former smoker,           Cardiovascular hypertension, Pt. on medications and Pt. on home beta blockers   Study Highlights 2017    Blood pressure demonstrated a normal response to exercise.  There was no ST segment deviation noted during stress.   Normal ETT Patient achieved only 89% of PMHR     Neuro/Psych negative neurological ROS     GI/Hepatic negative GI ROS, Neg liver ROS,   Endo/Other  diabetes  Renal/GU negative Renal ROS     Musculoskeletal negative musculoskeletal ROS (+)   Abdominal   Peds  Hematology negative hematology ROS (+)   Anesthesia Other Findings Day of surgery medications reviewed with the patient.  Reproductive/Obstetrics                             Anesthesia Physical Anesthesia Plan  ASA: II  Anesthesia Plan: General   Post-op Pain Management:    Induction: Intravenous  PONV Risk Score and Plan: 4 or greater and Ondansetron, Dexamethasone, Scopolamine patch - Pre-op and Diphenhydramine  Airway Management Planned: LMA  Additional Equipment:   Intra-op Plan:   Post-operative Plan: Extubation in OR  Informed Consent:   Plan Discussed with:   Anesthesia Plan Comments:         Anesthesia Quick Evaluation

## 2017-10-09 DIAGNOSIS — N201 Calculus of ureter: Secondary | ICD-10-CM | POA: Diagnosis not present

## 2018-01-14 ENCOUNTER — Telehealth: Payer: Self-pay | Admitting: Family Medicine

## 2018-01-14 DIAGNOSIS — D649 Anemia, unspecified: Secondary | ICD-10-CM

## 2018-01-14 DIAGNOSIS — R5383 Other fatigue: Secondary | ICD-10-CM

## 2018-01-14 NOTE — Telephone Encounter (Signed)
Pt would like to have her Iron and Hemoglobin rechecked. She is feeling tired constantly, she is falling asleep in the middle of conversations and just feels exhausted and extreme fatigue all the time.

## 2018-01-14 NOTE — Telephone Encounter (Signed)
CBC, ferritin, TSH Diagnosis other fatigue, iron deficient anemia

## 2018-01-14 NOTE — Telephone Encounter (Signed)
Blood work ordered in Epic. Patient notified. 

## 2018-01-15 DIAGNOSIS — R5383 Other fatigue: Secondary | ICD-10-CM | POA: Diagnosis not present

## 2018-01-15 DIAGNOSIS — D649 Anemia, unspecified: Secondary | ICD-10-CM | POA: Diagnosis not present

## 2018-01-16 LAB — CBC WITH DIFFERENTIAL/PLATELET
BASOS ABS: 0.1 10*3/uL (ref 0.0–0.2)
Basos: 1 %
EOS (ABSOLUTE): 0.2 10*3/uL (ref 0.0–0.4)
Eos: 2 %
Hematocrit: 37.7 % (ref 34.0–46.6)
Hemoglobin: 12.5 g/dL (ref 11.1–15.9)
IMMATURE GRANS (ABS): 0 10*3/uL (ref 0.0–0.1)
IMMATURE GRANULOCYTES: 0 %
Lymphocytes Absolute: 3.2 10*3/uL — ABNORMAL HIGH (ref 0.7–3.1)
Lymphs: 31 %
MCH: 28.5 pg (ref 26.6–33.0)
MCHC: 33.2 g/dL (ref 31.5–35.7)
MCV: 86 fL (ref 79–97)
Monocytes Absolute: 1.2 10*3/uL — ABNORMAL HIGH (ref 0.1–0.9)
Monocytes: 11 %
NEUTROS ABS: 5.8 10*3/uL (ref 1.4–7.0)
NEUTROS PCT: 55 %
PLATELETS: 424 10*3/uL (ref 150–450)
RBC: 4.39 x10E6/uL (ref 3.77–5.28)
RDW: 14.4 % (ref 12.3–15.4)
WBC: 10.4 10*3/uL (ref 3.4–10.8)

## 2018-01-16 LAB — TSH: TSH: 6.14 u[IU]/mL — ABNORMAL HIGH (ref 0.450–4.500)

## 2018-01-16 LAB — FERRITIN: FERRITIN: 33 ng/mL (ref 15–150)

## 2018-01-19 NOTE — Addendum Note (Signed)
Addended by: Karle Barr on: 01/19/2018 11:41 AM   Modules accepted: Orders

## 2018-01-30 ENCOUNTER — Encounter

## 2018-01-30 ENCOUNTER — Ambulatory Visit: Payer: BLUE CROSS/BLUE SHIELD | Admitting: Family Medicine

## 2018-03-11 ENCOUNTER — Telehealth: Payer: Self-pay | Admitting: *Deleted

## 2018-03-11 ENCOUNTER — Encounter: Payer: Self-pay | Admitting: Family Medicine

## 2018-03-11 NOTE — Telephone Encounter (Signed)
No available slots for tomorrow until 4:50 so advised pt to go to urgent care or ED.

## 2018-03-11 NOTE — Telephone Encounter (Signed)
Very difficult to know what is causing this At the very least the patient needs to be seen I recommend either urgent care visit tonight for office visit tomorrow If progressively worse seek help at urgent care or ER

## 2018-03-11 NOTE — Telephone Encounter (Signed)
Good Afternoon,  I do not have a gyno and I am not sure if this is something I can come there for or not. I started my menstrual Monday. It's very heavy. I've not had any issues with it before. It's heavy enough that I had to change clothes at work today and I am using more pads than normal. I had to get up several times last night to change. I have nausea, dizziness, headaches which are normal sometimes but not usually to this degree. Also have lower abdomen and right back pain since yesterday. The back pain almost feels like another kidney stone starting. Should I wait until my period end date of Friday and go from there? I know you guys are super busy and I hate to bother you, I am just concerned of how heavy it is and how I am feeling. My period has never been a problem for me. It is interfering with daily activities and work. Maybe this is normal for my age?  Thank you for your time.  Niyati Otila Kluver)    This was pt's my chart message. I tried calling pt to get more info but no answer. I left a message for her to call back.

## 2018-03-11 NOTE — Telephone Encounter (Signed)
Had night sweats the last two nights, abdominal pain started yesterday. Pain on lower right and left side, mostly on right side now, pain lower right side of back also. Started out feeling like cramps but now worse and feels like pain she had when she had kidney stone. Having to change pad 2 -3 times an hour. Seems to have slowed down the last 30 -40 mins. Dizziness, nausea and headaches started yesterday. She usually has this when she starts cycle but not this bad. Pain, dizziness, nausea and headaches have been constant.   Pt is at work and voicemail is not working. Told pt I would try to call her first and if I could not get her I would send her a mychart message. She states she would check her mychart

## 2018-05-12 ENCOUNTER — Encounter: Payer: Self-pay | Admitting: Family Medicine

## 2018-05-13 NOTE — Telephone Encounter (Signed)
There is blood work that was ordered back in August The patient can do this at her convenience If having ongoing leg pain patient can also schedule follow-up office visit

## 2018-05-16 DIAGNOSIS — R5383 Other fatigue: Secondary | ICD-10-CM | POA: Diagnosis not present

## 2018-05-16 DIAGNOSIS — D649 Anemia, unspecified: Secondary | ICD-10-CM | POA: Diagnosis not present

## 2018-05-17 LAB — HEMOGLOBIN AND HEMATOCRIT, BLOOD
HEMATOCRIT: 35.5 % (ref 34.0–46.6)
Hemoglobin: 11.4 g/dL (ref 11.1–15.9)

## 2018-05-17 LAB — FERRITIN: FERRITIN: 11 ng/mL — AB (ref 15–150)

## 2018-05-17 LAB — T4, FREE: Free T4: 0.81 ng/dL — ABNORMAL LOW (ref 0.82–1.77)

## 2018-05-17 LAB — TSH: TSH: 5.52 u[IU]/mL — ABNORMAL HIGH (ref 0.450–4.500)

## 2018-06-01 ENCOUNTER — Ambulatory Visit: Payer: BLUE CROSS/BLUE SHIELD | Admitting: Family Medicine

## 2018-06-19 ENCOUNTER — Encounter: Payer: Self-pay | Admitting: Family Medicine

## 2018-06-19 ENCOUNTER — Ambulatory Visit: Payer: BLUE CROSS/BLUE SHIELD | Admitting: Family Medicine

## 2018-06-19 VITALS — BP 128/84 | Temp 98.2°F | Ht 62.0 in | Wt 145.0 lb

## 2018-06-19 DIAGNOSIS — D509 Iron deficiency anemia, unspecified: Secondary | ICD-10-CM | POA: Diagnosis not present

## 2018-06-19 DIAGNOSIS — E785 Hyperlipidemia, unspecified: Secondary | ICD-10-CM | POA: Diagnosis not present

## 2018-06-19 DIAGNOSIS — N921 Excessive and frequent menstruation with irregular cycle: Secondary | ICD-10-CM | POA: Diagnosis not present

## 2018-06-19 NOTE — Patient Instructions (Signed)
Results for orders placed or performed in visit on 01/14/18  CBC with Differential/Platelet  Result Value Ref Range   WBC 10.4 3.4 - 10.8 x10E3/uL   RBC 4.39 3.77 - 5.28 x10E6/uL   Hemoglobin 12.5 11.1 - 15.9 g/dL   Hematocrit 37.7 34.0 - 46.6 %   MCV 86 79 - 97 fL   MCH 28.5 26.6 - 33.0 pg   MCHC 33.2 31.5 - 35.7 g/dL   RDW 14.4 12.3 - 15.4 %   Platelets 424 150 - 450 x10E3/uL   Neutrophils 55 Not Estab. %   Lymphs 31 Not Estab. %   Monocytes 11 Not Estab. %   Eos 2 Not Estab. %   Basos 1 Not Estab. %   Neutrophils Absolute 5.8 1.4 - 7.0 x10E3/uL   Lymphocytes Absolute 3.2 (H) 0.7 - 3.1 x10E3/uL   Monocytes Absolute 1.2 (H) 0.1 - 0.9 x10E3/uL   EOS (ABSOLUTE) 0.2 0.0 - 0.4 x10E3/uL   Basophils Absolute 0.1 0.0 - 0.2 x10E3/uL   Immature Granulocytes 0 Not Estab. %   Immature Grans (Abs) 0.0 0.0 - 0.1 x10E3/uL  Ferritin  Result Value Ref Range   Ferritin 33 15 - 150 ng/mL  TSH  Result Value Ref Range   TSH 6.140 (H) 0.450 - 4.500 uIU/mL  Ferritin  Result Value Ref Range   Ferritin 11 (L) 15 - 150 ng/mL  TSH  Result Value Ref Range   TSH 5.520 (H) 0.450 - 4.500 uIU/mL  T4, free  Result Value Ref Range   Free T4 0.81 (L) 0.82 - 1.77 ng/dL  Hemoglobin and hematocrit, blood  Result Value Ref Range   Hemoglobin 11.4 11.1 - 15.9 g/dL   Hematocrit 35.5 34.0 - 46.6 %

## 2018-06-19 NOTE — Progress Notes (Signed)
   Subjective:    Patient ID: Samantha Moreno, female    DOB: [birth year], 48 y.o.   MRN: [id number]  HPI  Patient is here today to discuss her recent lab work that she had drawn Dec 07,2020. This patient states overall she feels pretty good most days  She does relate fatigue tiredness a fair amount of the time along with leg pain and discomfort intermittently  She also had some recent lab work done which shows subclinical hypothyroidism which is getting slightly worse  She also has iron deficient anemia but taking oral iron tablets causes her nausea not she cannot afford to do iron infusions  She also relates a lot of menorrhagia and frequent heavy cycles Review of Systems  Constitutional: Negative for activity change, fatigue and fever.  HENT: Negative for congestion and rhinorrhea.   Respiratory: Negative for cough, chest tightness and shortness of breath.   Cardiovascular: Negative for chest pain and leg swelling.  Gastrointestinal: Negative for abdominal pain and nausea.  Skin: Negative for color change.  Neurological: Negative for dizziness and headaches.  Psychiatric/Behavioral: Negative for agitation and behavioral problems.       Objective:   Physical Exam Vitals signs reviewed.  Constitutional:      General: She is not in acute distress. HENT:     Head: Normocephalic and atraumatic.  Eyes:     General:        Right eye: No discharge.        Left eye: No discharge.  Neck:     Trachea: No tracheal deviation.  Cardiovascular:     Rate and Rhythm: Normal rate and regular rhythm.     Heart sounds: Normal heart sounds. No murmur.  Pulmonary:     Effort: Pulmonary effort is normal. No respiratory distress.     Breath sounds: Normal breath sounds.  Lymphadenopathy:     Cervical: No cervical adenopathy.  Skin:    General: Skin is warm and dry.  Neurological:     Mental Status: She is alert.     Coordination: Coordination normal.  Psychiatric:        Behavior:  Behavior normal.           Assessment & Plan:  Subclinical hypothyroidism- because the T4 would start to go down I did recommend the patient go ahead be on medication the patient defers on this she would like to think about it.  She states that she would like to repeat her lab work again in 6 months  Hyperlipidemia patient encouraged healthy eating regular activity-has family history of heart disease-does not want to be on medication-  Heavy cycles- she will go ahead with referral to gynecology will need possible ablation  Iron deficiency anemia does not tolerate regular iron tablets we will connect with gastroenterology to see what tablets they use that she might be able to tolerate better patient states she cannot afford iron infusions  Follow-up 6 months lab work at that time

## 2018-06-24 ENCOUNTER — Encounter: Payer: Self-pay | Admitting: Family Medicine

## 2018-06-25 ENCOUNTER — Telehealth: Payer: Self-pay | Admitting: Certified Nurse Midwife

## 2018-06-25 NOTE — Telephone Encounter (Signed)
Called and left a message for patient to call back to schedule a new patient doctor referral appointment with our office to see any provider re: Menorrhagia.

## 2018-06-26 NOTE — Telephone Encounter (Signed)
Called and left a message for patient to call back to schedule a new patient doctor referral appointment with our office to seeany providerre: Menorrhagia.

## 2018-06-28 ENCOUNTER — Telehealth: Payer: Self-pay | Admitting: Family Medicine

## 2018-06-28 NOTE — Telephone Encounter (Signed)
I discussed with the patient on her last visit the iron deficiency she related how regular iron tablets made her nauseous  I spoke with gastroenterology they recommended trying Fusion Plus This is a iron supplement 1 daily Available at [facility] over-the-counter [facility] can have some coupon she can find these online  If she does try this I recommend repeating her ferritin by summertime

## 2018-06-29 NOTE — Telephone Encounter (Signed)
Pt returned call. Pt advised that Dr.Scott spoke with GI and they recommended Fusion Plus Iron tablet one daily that is available over the counter at [facility] and [facility] has coupons. Also informed patient that if she tries the Fusion Plus that Dr.Scott would like her to have ferritin level drawn by summertime. Pt verbalized understanding.

## 2018-06-29 NOTE — Telephone Encounter (Signed)
Called and left a message for patient to call back to schedule a new patient doctor referral appointment with our office to seeany providerre: Menorrhagia.

## 2018-06-29 NOTE — Telephone Encounter (Signed)
Left message to return call 

## 2018-07-01 ENCOUNTER — Encounter: Payer: Self-pay | Admitting: Family Medicine

## 2018-07-01 ENCOUNTER — Telehealth: Payer: Self-pay | Admitting: Family Medicine

## 2018-07-01 NOTE — Telephone Encounter (Signed)
Please note-patient was referred to GYN because of menorrhagia GYN office is tried to call the patient multiple times unable to connect with the patient therefore they have stopped trying to reach the patient

## 2018-07-01 NOTE — Telephone Encounter (Signed)
I'm routing this referral back to the referring office. Patient has not returned several calls from our office to schedule an appointment.

## 2018-07-03 ENCOUNTER — Telehealth: Payer: Self-pay | Admitting: Family Medicine

## 2018-07-03 NOTE — Telephone Encounter (Signed)
LMOVM - need to verify address & mailing address  Mail was returned

## 2018-07-08 ENCOUNTER — Ambulatory Visit: Payer: BLUE CROSS/BLUE SHIELD | Admitting: Family Medicine

## 2018-07-08 ENCOUNTER — Encounter: Payer: Self-pay | Admitting: Family Medicine

## 2018-07-08 VITALS — BP 118/72 | Temp 98.2°F | Ht 62.0 in | Wt 146.0 lb

## 2018-07-08 DIAGNOSIS — B9689 Other specified bacterial agents as the cause of diseases classified elsewhere: Secondary | ICD-10-CM | POA: Diagnosis not present

## 2018-07-08 DIAGNOSIS — J019 Acute sinusitis, unspecified: Secondary | ICD-10-CM

## 2018-07-08 DIAGNOSIS — H6501 Acute serous otitis media, right ear: Secondary | ICD-10-CM | POA: Diagnosis not present

## 2018-07-08 MED ORDER — CEFPROZIL 500 MG PO TABS: 500.0000 mg | ORAL_TABLET | Freq: Two times a day (BID) | ORAL | 0 refills | Status: DC

## 2018-07-08 MED ORDER — HYDROCODONE-ACETAMINOPHEN 5-325 MG PO TABS: 1.0000 | ORAL_TABLET | Freq: Four times a day (QID) | ORAL | 0 refills | Status: DC | PRN

## 2018-07-08 NOTE — Progress Notes (Signed)
   Subjective:    Patient ID: Samantha Moreno, female    DOB: 10-Oct-1970, 48 y.o.   MRN: [id number]  Cough  This is a new problem. The current episode started in the past 7 days. Associated symptoms include ear pain, headaches and nasal congestion. Associated symptoms comments: diarrhea. Treatments tried: vit c and Advil.   Felt a little dizy Monday  Felt soe ocngestion   Last night got worse  Right ear pain  Congested  When you take deep breath some discomfort  No recent meed for wheezing   No fever   Dull disffuse headache  Right ear pain the pmain thing   Taking vit c pack s    Pos work exposure to sickness     Cough usually mild ly productive not a lot    Review of Systems  HENT: Positive for ear pain.   Respiratory: Positive for cough.   Neurological: Positive for headaches.       Objective:   Physical Exam   Alert, mild malaise. Hydration good Vitals stable.  Right TM retraction frontal/ maxillary tenderness evident positive nasal congestion. pharynx normal neck supple  lungs clear/no crackles or wheezes. heart regular in rhythm      Assessment & Plan:  Impression early right otitis media/rhinosinusitis likely post viral, viral specifically daily mild fluid rationale discussed, discussed with patient. plan antibiotics prescribed. Questions answered. Symptomatic care discussed. warning signs discussed. WSL

## 2018-07-09 ENCOUNTER — Encounter: Payer: Self-pay | Admitting: Family Medicine

## 2018-07-09 MED ORDER — PROMETHAZINE HCL 25 MG PO TABS: 25.0000 mg | ORAL_TABLET | Freq: Four times a day (QID) | ORAL | 0 refills | Status: DC | PRN

## 2018-07-12 ENCOUNTER — Encounter: Payer: Self-pay | Admitting: Family Medicine

## 2018-07-13 ENCOUNTER — Telehealth: Payer: Self-pay | Admitting: Family Medicine

## 2018-07-13 MED ORDER — BENZONATATE 100 MG PO CAPS: 100.0000 mg | ORAL_CAPSULE | Freq: Three times a day (TID) | ORAL | 0 refills | Status: DC | PRN

## 2018-07-13 MED ORDER — CLARITHROMYCIN 500 MG PO TABS: 500.0000 mg | ORAL_TABLET | Freq: Two times a day (BID) | ORAL | 0 refills | Status: DC

## 2018-07-13 NOTE — Telephone Encounter (Signed)
Patient was seen 1/29 stating feeling worst has bad cough,hurting in the middle of her back and has a headache now. Will she need a recheck.

## 2018-07-13 NOTE — Telephone Encounter (Signed)
Patient states she is still having the sinus sx and bad ear pain but the bad cough is now new and her back is hurting from all the coughing

## 2018-07-13 NOTE — Telephone Encounter (Signed)
Prescriptions sent electronically to pharmacy. Patient notified and will follow up if ongoing issues.

## 2018-07-13 NOTE — Telephone Encounter (Signed)
biaxin 500 bid ten d, tess perles 100 mg thirty one tid prn couhg, advil for pain, see dr Nicki Reaper if persists

## 2018-07-13 NOTE — Telephone Encounter (Signed)
Ntsw, then spk with dr Nicki Reaper re pt's symtoms, could be progression, but also could be flu. Try to get full hx, relatively udden onset of all these other features?

## 2018-07-13 NOTE — Telephone Encounter (Signed)
Also see mychart message

## 2018-07-15 ENCOUNTER — Encounter: Payer: Self-pay | Admitting: Family Medicine

## 2018-07-15 ENCOUNTER — Ambulatory Visit: Payer: BLUE CROSS/BLUE SHIELD | Admitting: Family Medicine

## 2018-07-15 ENCOUNTER — Telehealth: Payer: Self-pay | Admitting: Family Medicine

## 2018-07-15 VITALS — BP 168/100 | Temp 98.5°F | Ht 62.0 in | Wt 146.0 lb

## 2018-07-15 DIAGNOSIS — J019 Acute sinusitis, unspecified: Secondary | ICD-10-CM | POA: Diagnosis not present

## 2018-07-15 DIAGNOSIS — B9689 Other specified bacterial agents as the cause of diseases classified elsewhere: Secondary | ICD-10-CM | POA: Diagnosis not present

## 2018-07-15 NOTE — Telephone Encounter (Signed)
Patient has office visit 07/15/18 with Dr Nicki Reaper for a recheck

## 2018-07-15 NOTE — Telephone Encounter (Signed)
1.  It is really hard to tell via phone whether or not this is an issue that needs further work-up/testing or just a different approach 2.  It would be reasonable to do a recheck I could see her at the end of the day 3.  If she is unable to come at the end of the day we could shoot for tomorrow 4.  As for the antibiotic I would continue it 5.  As for the cold/cough medication she could stop it 6.  Tylenol or ibuprofen for her aches 7.  Cough is there for reason it is unlikely that any cough medicine will stop that it is better to treat the underlying problem

## 2018-07-15 NOTE — Progress Notes (Signed)
   Subjective:    Patient ID: Samantha Moreno, female    DOB: Nov 18, 1970, 48 y.o.   MRN: [id number]  HPI Patient is here today to recheck from 07/08/2018 when seen by Dr.Steve Keilen Kahl.  He diagnosed her with right otitis media/rhinosinusitis likely viral.  She was given cefprozil 500 mg bid,she was asked to stop it after five days and start Biaxin 500 mg one bid.  She is still not feeling well. She states upper mid back and shoulder pain, Still has chest congestion. She wanted to know if she should be showing signs of improvement by know. Also is she contagious,can she return to work.  She is not taking any thing else.     Review of Systems  Constitutional: Negative for activity change and fever.  HENT: Positive for congestion and rhinorrhea. Negative for ear pain.   Eyes: Negative for discharge.  Respiratory: Positive for cough. Negative for shortness of breath and wheezing.   Cardiovascular: Negative for chest pain.       Objective:   Physical Exam Vitals signs and nursing note reviewed.  Constitutional:      Appearance: She is well-developed.  HENT:     Head: Normocephalic.     Nose: Nose normal.     Mouth/Throat:     Pharynx: No oropharyngeal exudate.  Neck:     Musculoskeletal: Neck supple.  Cardiovascular:     Rate and Rhythm: Normal rate.     Heart sounds: Normal heart sounds. No murmur.  Pulmonary:     Effort: Pulmonary effort is normal.     Breath sounds: Normal breath sounds. No wheezing.  Lymphadenopathy:     Cervical: No cervical adenopathy.  Skin:    General: Skin is warm and dry.           Assessment & Plan:  Persistent upper respiratory illness Viral syndrome sinusitis Stick with current antibiotic No sign of pneumonia going on I do not recommend x-rays or lab work currently If progressive troubles or worse to follow-up.

## 2018-07-15 NOTE — Telephone Encounter (Signed)
Pt was seen last week and given an antibiotic. She took that medication for 5 days and was not feeling any better so we prescribed a different medication she has been taking that for 2 days and is still not feeling any better. She is wanting to know if she is contagious and should be at work or not. Also she is wanting to know if she should come in for a recheck or continue with this antibiotic for a couple of days. She is unable to take the cough medication due to it making her throat feel tingly. She is having some chest discomfort due to coughing and the mucus she is coughing up.   CB# [id number]

## 2018-07-15 NOTE — Telephone Encounter (Signed)
Left message to return call 

## 2018-07-15 NOTE — Telephone Encounter (Signed)
Patient was originally given Cefzil 07/08/18 at office visit and then was given Biaxin and Tessalon via phone message 07/13/18

## 2018-08-10 ENCOUNTER — Encounter: Payer: BLUE CROSS/BLUE SHIELD | Admitting: Obstetrics and Gynecology

## 2018-08-12 ENCOUNTER — Encounter: Payer: BLUE CROSS/BLUE SHIELD | Admitting: Obstetrics and Gynecology

## 2018-09-01 ENCOUNTER — Other Ambulatory Visit: Payer: Self-pay | Admitting: Family Medicine

## 2018-09-01 ENCOUNTER — Encounter: Payer: Self-pay | Admitting: Family Medicine

## 2018-09-03 ENCOUNTER — Other Ambulatory Visit: Payer: Self-pay | Admitting: Family Medicine

## 2018-09-03 MED ORDER — HYDROCODONE-ACETAMINOPHEN 5-325 MG PO TABS: 1.0000 | ORAL_TABLET | Freq: Four times a day (QID) | ORAL | 0 refills | Status: DC | PRN

## 2018-09-03 NOTE — Telephone Encounter (Signed)
Refill were sent in as requested

## 2018-09-03 NOTE — Telephone Encounter (Signed)
Pt returned call and verbalized understanding  

## 2018-09-03 NOTE — Telephone Encounter (Signed)
I called left a message to r/c. 

## 2018-10-12 ENCOUNTER — Encounter: Payer: Self-pay | Admitting: Family Medicine

## 2018-10-12 DIAGNOSIS — N921 Excessive and frequent menstruation with irregular cycle: Secondary | ICD-10-CM

## 2018-10-12 DIAGNOSIS — R5383 Other fatigue: Secondary | ICD-10-CM

## 2018-10-12 DIAGNOSIS — D509 Iron deficiency anemia, unspecified: Secondary | ICD-10-CM

## 2018-10-12 NOTE — Telephone Encounter (Signed)
Pt's last bloodwork was 05/17/15 for hemoglobin, hematocrit, free t4, tsh, and ferritin.   Called Joselin and made her daughter an appt for today and asked pt to only send messages for herself through her mychart because this info is saved in her medical record instead of [facility]son's. Mother verbalized understanding.

## 2018-10-13 NOTE — Telephone Encounter (Signed)
The patient may do lab work I would recommend ferritin, CBC, free T4, TSH  Iron deficient anemia Subclinical hypothyroidism

## 2018-10-13 NOTE — Addendum Note (Signed)
Addended by: Dairl Ponder on: 10/13/2018 04:26 PM   Modules accepted: Orders

## 2018-10-21 DIAGNOSIS — R5383 Other fatigue: Secondary | ICD-10-CM | POA: Diagnosis not present

## 2018-10-21 DIAGNOSIS — D509 Iron deficiency anemia, unspecified: Secondary | ICD-10-CM | POA: Diagnosis not present

## 2018-10-21 DIAGNOSIS — N921 Excessive and frequent menstruation with irregular cycle: Secondary | ICD-10-CM | POA: Diagnosis not present

## 2018-10-22 LAB — CBC WITH DIFFERENTIAL/PLATELET
Basophils Absolute: 0.1 10*3/uL (ref 0.0–0.2)
Basos: 1 %
EOS (ABSOLUTE): 0.1 10*3/uL (ref 0.0–0.4)
Eos: 2 %
Hematocrit: 41.8 % (ref 34.0–46.6)
Hemoglobin: 13.9 g/dL (ref 11.1–15.9)
Immature Grans (Abs): 0 10*3/uL (ref 0.0–0.1)
Immature Granulocytes: 0 %
Lymphocytes Absolute: 2.2 10*3/uL (ref 0.7–3.1)
Lymphs: 27 %
MCH: 27.7 pg (ref 26.6–33.0)
MCHC: 33.3 g/dL (ref 31.5–35.7)
MCV: 83 fL (ref 79–97)
Monocytes Absolute: 0.7 10*3/uL (ref 0.1–0.9)
Monocytes: 8 %
Neutrophils Absolute: 5 10*3/uL (ref 1.4–7.0)
Neutrophils: 62 %
Platelets: 352 10*3/uL (ref 150–450)
RBC: 5.01 x10E6/uL (ref 3.77–5.28)
RDW: 15.9 % — ABNORMAL HIGH (ref 11.7–15.4)
WBC: 8 10*3/uL (ref 3.4–10.8)

## 2018-10-22 LAB — T4, FREE: Free T4: 1 ng/dL (ref 0.82–1.77)

## 2018-10-22 LAB — FERRITIN: Ferritin: 42 ng/mL (ref 15–150)

## 2018-10-22 LAB — TSH: TSH: 4.29 u[IU]/mL (ref 0.450–4.500)

## 2018-10-31 DIAGNOSIS — S62606A Fracture of unspecified phalanx of right little finger, initial encounter for closed fracture: Secondary | ICD-10-CM | POA: Diagnosis not present

## 2018-11-08 IMAGING — DX DG CHEST 2V
2 series · 2 of 2 positions shown · non-contrast
Comparison: None.

CLINICAL DATA: Rt sided chest pain, radiates to rt side of neck,
intermittent x 2 days, sob with exertion. Dizziness, nausea x 5
days. History of DM, HTN.

EXAM:
CHEST  2 VIEW

[chest pa]
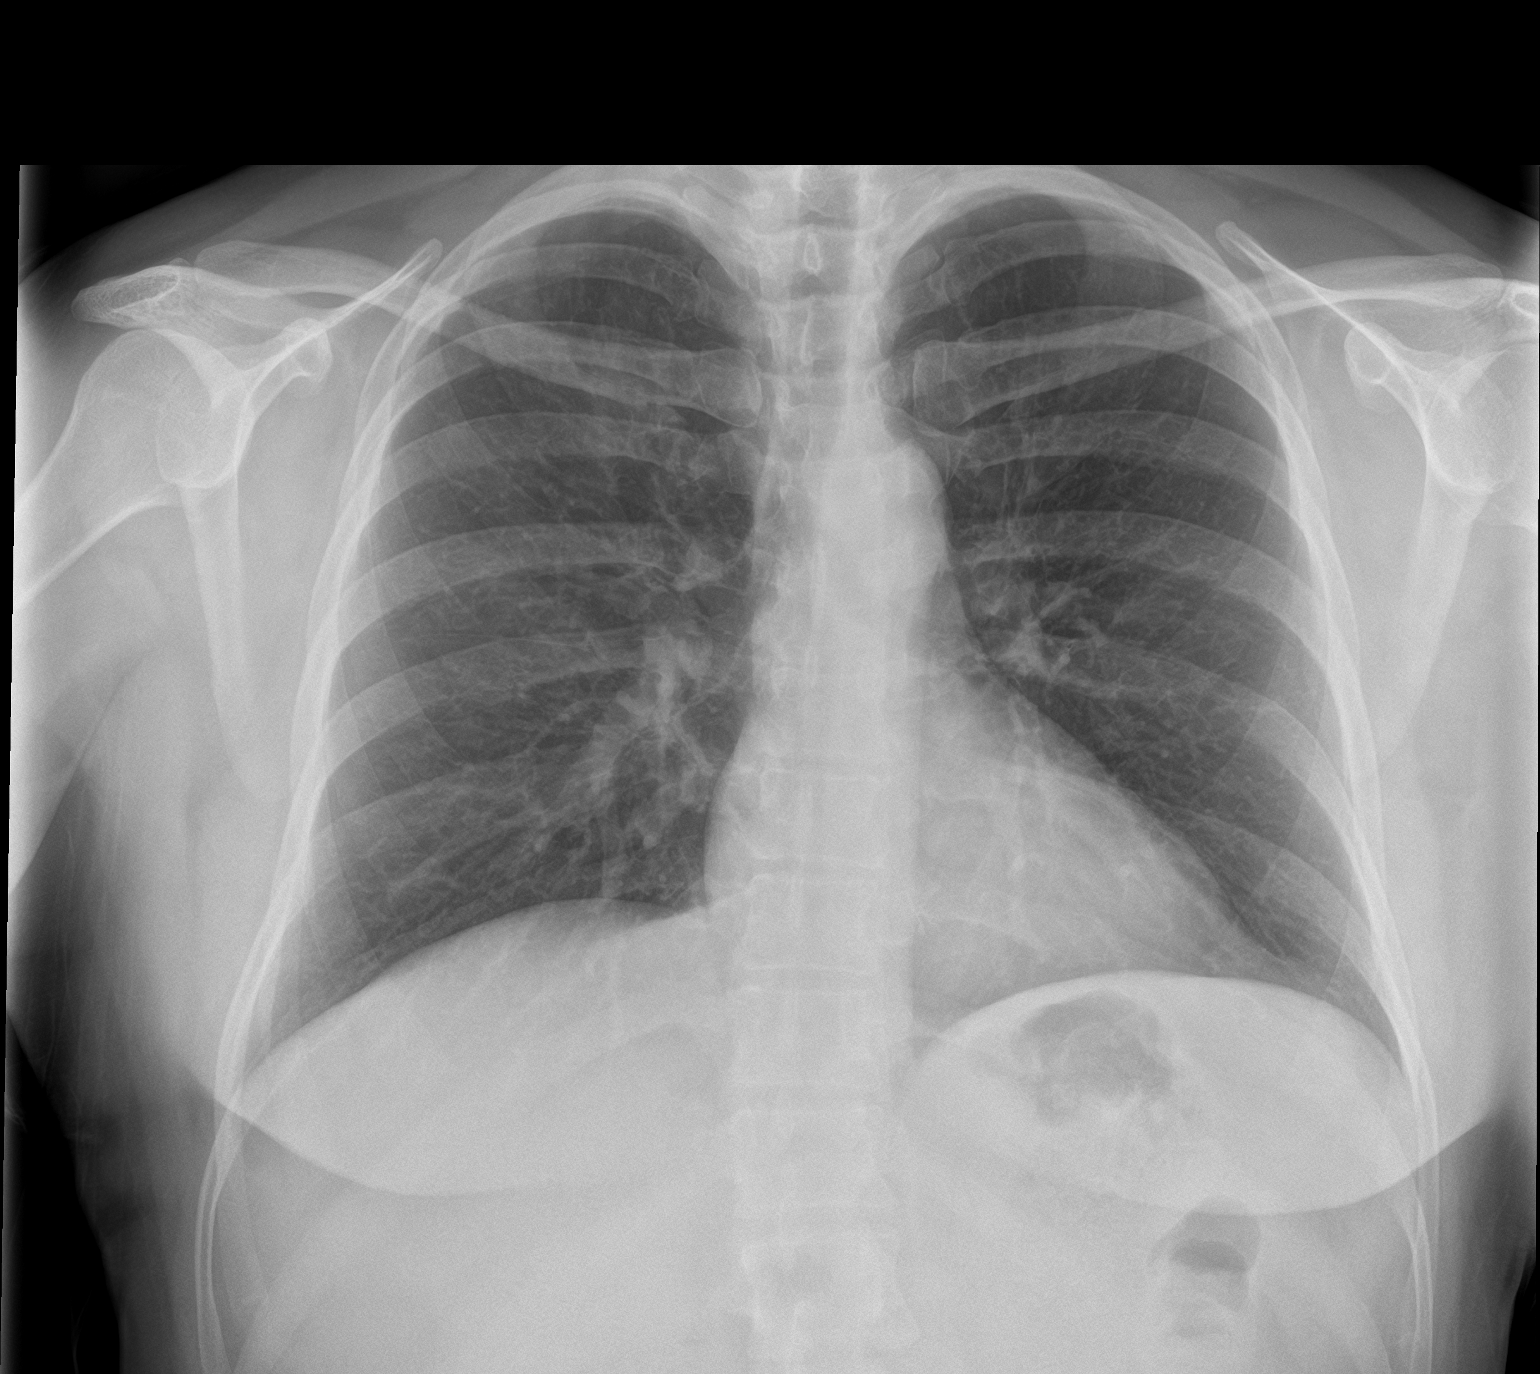

[chest lat]
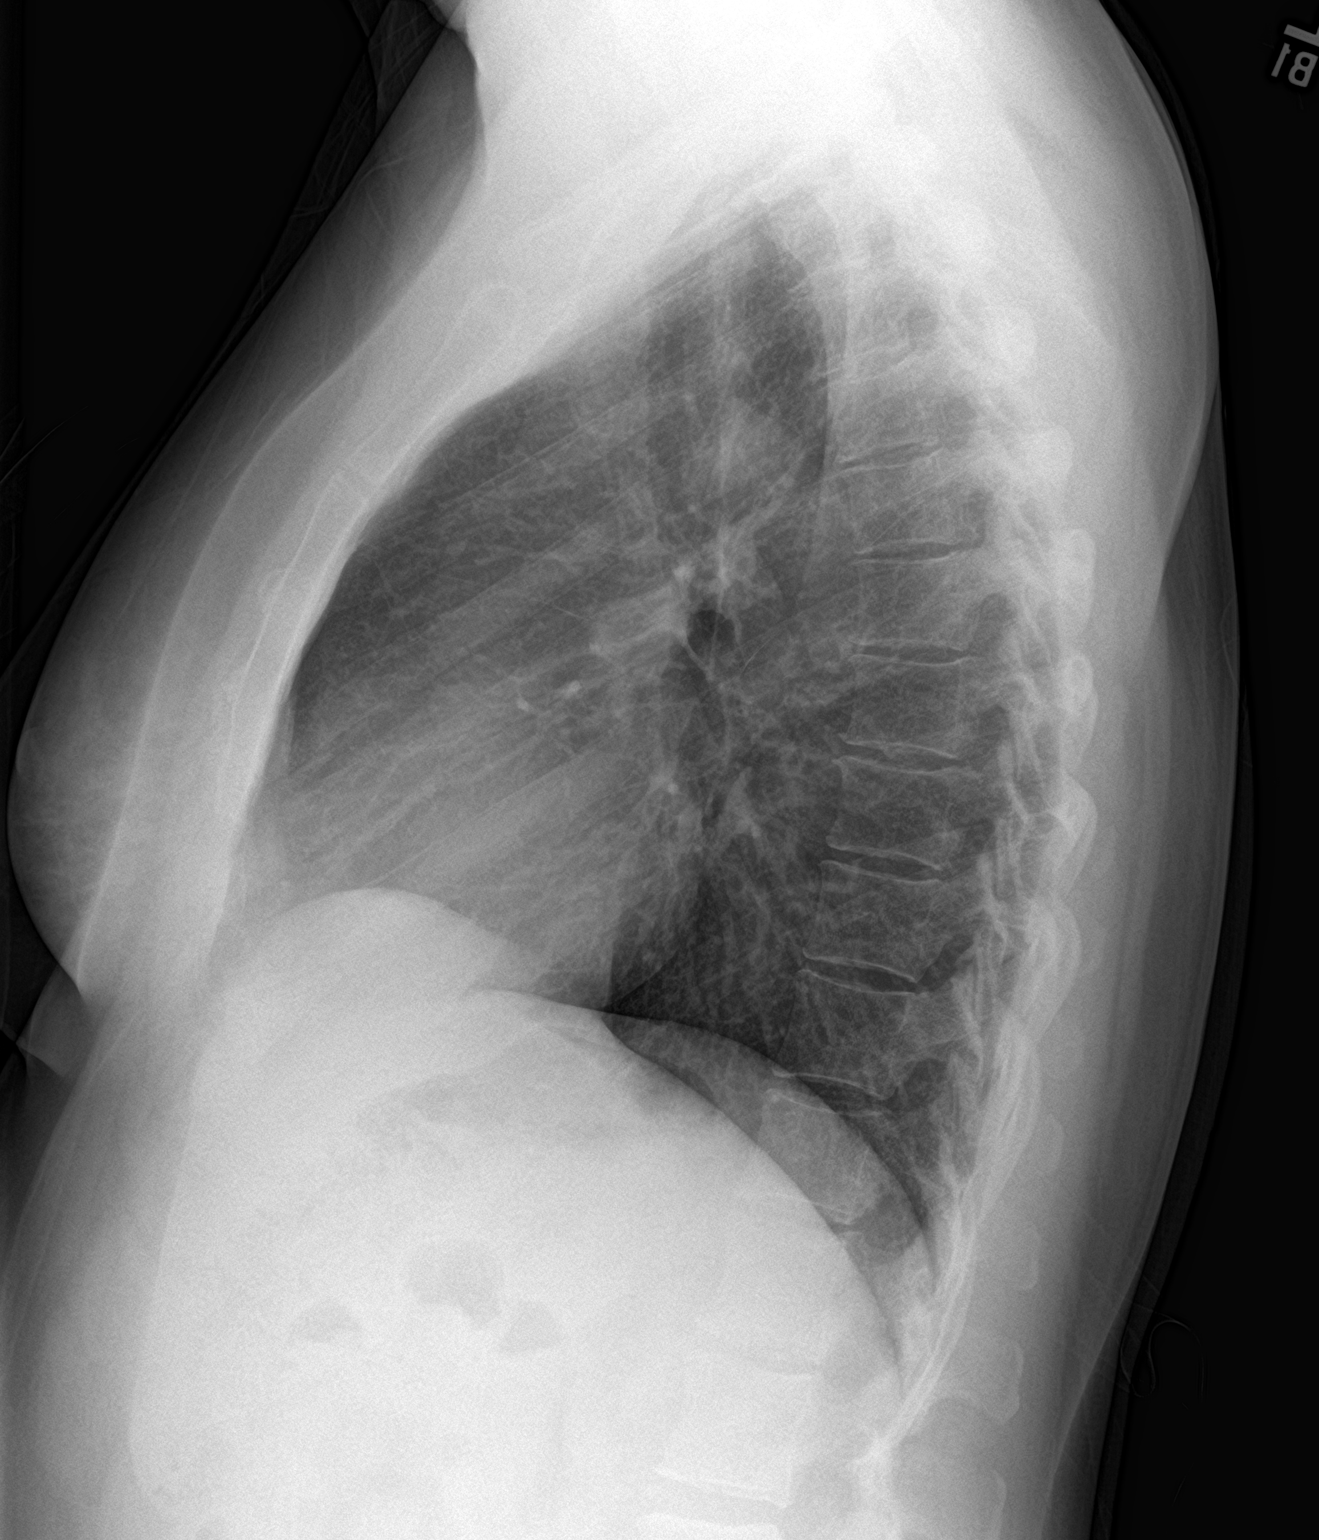

[2 of 2 positions shown; findings below may reference images not displayed]

FINDINGS: Normal mediastinum and cardiac silhouette. Normal pulmonary
vasculature. No evidence of effusion, infiltrate, or pneumothorax.
No acute bony abnormality.
IMPRESSION: Normal chest radiograph.

## 2018-12-14 ENCOUNTER — Encounter: Payer: Self-pay | Admitting: Family Medicine

## 2019-03-18 ENCOUNTER — Other Ambulatory Visit: Payer: Self-pay | Admitting: Family Medicine

## 2019-03-18 NOTE — Telephone Encounter (Signed)
May have 3 refills on the metoprolol Will need follow-up visit later this fall Hydrocodone because it is a controlled schedule II I have not seen the patient in the past several months this requires an office visit and discussion

## 2019-03-19 ENCOUNTER — Other Ambulatory Visit: Payer: Self-pay

## 2019-03-19 MED ORDER — METOPROLOL SUCCINATE ER 25 MG PO TB24: 25.0000 mg | ORAL_TABLET | Freq: Every day | ORAL | 0 refills | Status: DC

## 2019-03-19 NOTE — Telephone Encounter (Signed)
Left message to return call 

## 2019-03-21 ENCOUNTER — Other Ambulatory Visit: Payer: Self-pay

## 2019-03-22 ENCOUNTER — Ambulatory Visit (INDEPENDENT_AMBULATORY_CARE_PROVIDER_SITE_OTHER): Payer: BC Managed Care – PPO | Admitting: Family Medicine

## 2019-03-22 ENCOUNTER — Encounter: Payer: Self-pay | Admitting: Family Medicine

## 2019-03-22 VITALS — BP 132/82

## 2019-03-22 DIAGNOSIS — D509 Iron deficiency anemia, unspecified: Secondary | ICD-10-CM

## 2019-03-22 DIAGNOSIS — G4762 Sleep related leg cramps: Secondary | ICD-10-CM | POA: Diagnosis not present

## 2019-03-22 DIAGNOSIS — I10 Essential (primary) hypertension: Secondary | ICD-10-CM

## 2019-03-22 DIAGNOSIS — E785 Hyperlipidemia, unspecified: Secondary | ICD-10-CM

## 2019-03-22 HISTORY — DX: Sleep related leg cramps: G47.62

## 2019-03-22 MED ORDER — METOPROLOL SUCCINATE ER 25 MG PO TB24: 25.0000 mg | ORAL_TABLET | Freq: Every day | ORAL | 1 refills | Status: DC

## 2019-03-22 MED ORDER — HYDROCODONE-ACETAMINOPHEN 5-325 MG PO TABS: 1.0000 | ORAL_TABLET | Freq: Four times a day (QID) | ORAL | 0 refills | Status: DC | PRN

## 2019-03-22 NOTE — Progress Notes (Signed)
   Subjective:    Patient ID: Samantha Moreno, female    DOB: 29-Sep-1970, 48 y.o.   MRN: [id number] Telephone only Hypertension This is a chronic problem. Pertinent negatives include no chest pain or shortness of breath. There are no compliance problems.   Pt is taking Metoprolol 25 mg daily.  Takes medicine on a regular basis states blood pressure under good control Pt is also needing a refill on Hydrocodone 5-325 mg. Pt states she takes one prn q 6 hrs. uses this rarely because of leg pain associated with significant leg cramps that she gets in the evenings she does try to do some stretching for these  Virtual Visit via Telephone Note  I connected with Samantha Moreno on 03/22/19 at  9:30 AM EDT by telephone and verified that I am speaking with the correct person using two identifiers.  Location: Patient: home Provider: office   I discussed the limitations, risks, security and privacy concerns of performing an evaluation and management service by telephone and the availability of in person appointments. I also discussed with the patient that there may be a patient responsible charge related to this service. The patient expressed understanding and agreed to proceed.   History of Present Illness:    Observations/Objective:   Assessment and Plan:   Follow Up Instructions:    I discussed the assessment and treatment plan with the patient. The patient was provided an opportunity to ask questions and all were answered. The patient agreed with the plan and demonstrated an understanding of the instructions.   The patient was advised to call back or seek an in-person evaluation if the symptoms worsen or if the condition fails to improve as anticipated.  I provided 18 minutes of non-face-to-face time during this encounter.   Vicente Males, LPN     Review of Systems  Constitutional: Negative for activity change, appetite change and fatigue.  HENT: Negative for congestion  and rhinorrhea.   Respiratory: Negative for cough and shortness of breath.   Cardiovascular: Negative for chest pain and leg swelling.  Gastrointestinal: Negative for abdominal pain and diarrhea.  Endocrine: Negative for polydipsia and polyphagia.  Skin: Negative for color change.  Neurological: Negative for dizziness and weakness.  Psychiatric/Behavioral: Negative for behavioral problems and confusion.       Objective:   Physical Exam  Today's visit was via telephone Physical exam was not possible for this visit       Assessment & Plan:  HTN- Patient was seen today as part of a visit regarding hypertension. The importance of healthy diet and regular physical activity was discussed. The importance of compliance with medications discussed.  Ideal goal is to keep blood pressure low elevated levels certainly below [id number] when possible.  The patient was counseled that keeping blood pressure under control lessen his risk of complications.  The importance of regular follow-ups was discussed with the patient.  Low-salt diet such as DASH recommended.  Regular physical activity was recommended as well.  Patient was advised to keep regular follow-ups.   Muscle spasms patient will do lab work as requested Pain medicine for infrequent use Stretches recommended Ferritin because of iron deficient anemia history

## 2019-05-06 IMAGING — CT CT RENAL STONE PROTOCOL
2 of 4 series · 16 of 46 positions shown, 18 images · non-contrast
Comparison: 11/28/2016

CLINICAL DATA: Worsening right flank pain for 1 week. Nausea and
vomiting.

EXAM:
CT ABDOMEN AND PELVIS WITHOUT CONTRAST
TECHNIQUE: Multidetector CT imaging of the abdomen and pelvis was performed
following the standard protocol without IV contrast.

[Series 2: axial st · axial · 0.67mm/px · z∈[+1008,+1423]mm · 13 of 91 slices shown, 15 images]
[im 4/91  soft-tissue]
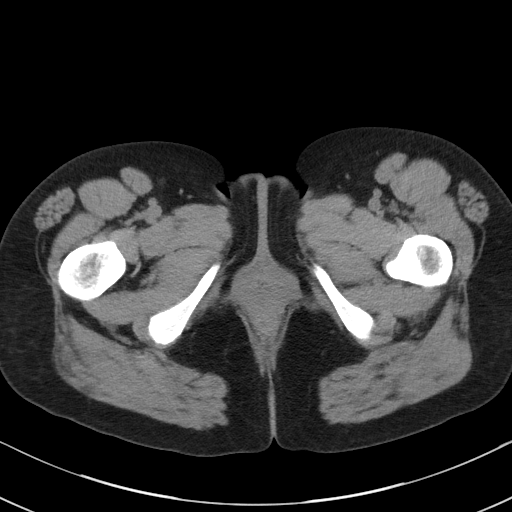
[im 4/91  bone]
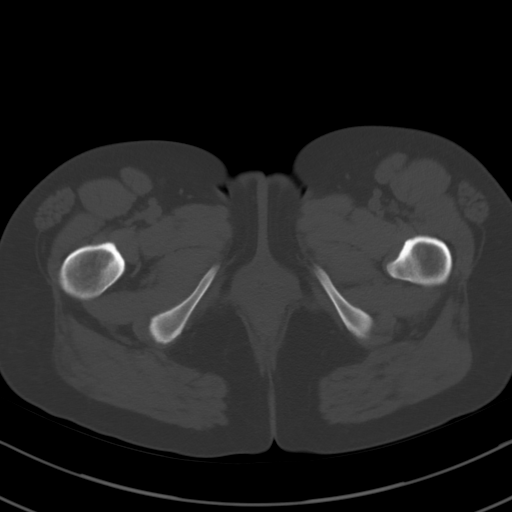
[im 12/91  soft-tissue]
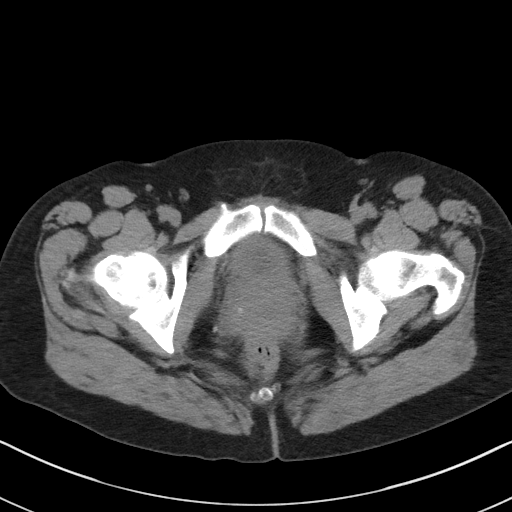
[im 20/91  soft-tissue]
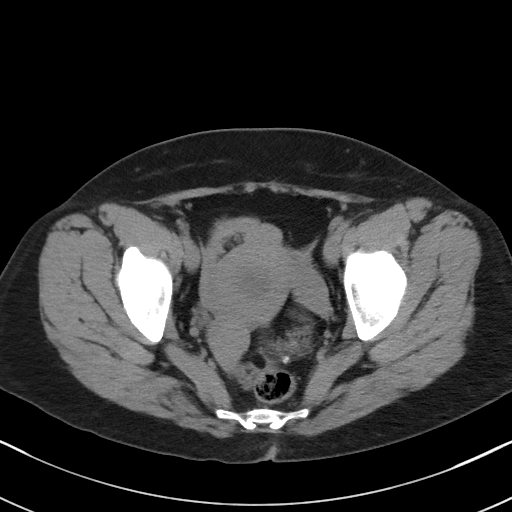
[im 24/91  soft-tissue]
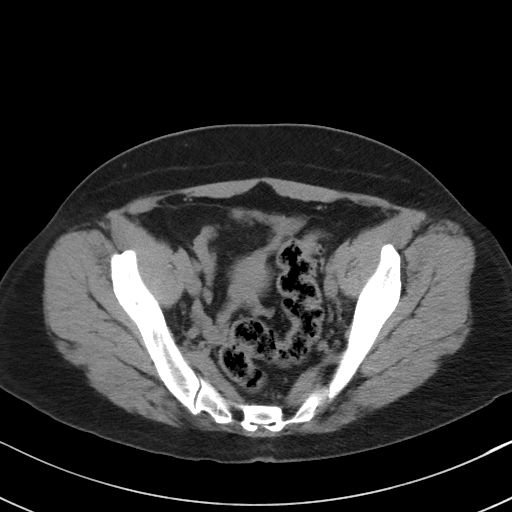
[im 32/91  soft-tissue]
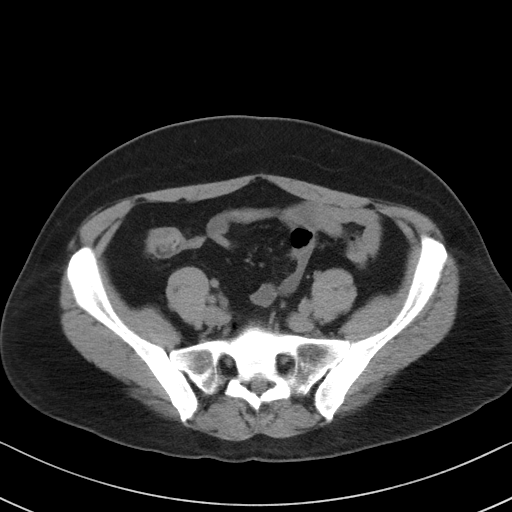
[im 40/91  soft-tissue]
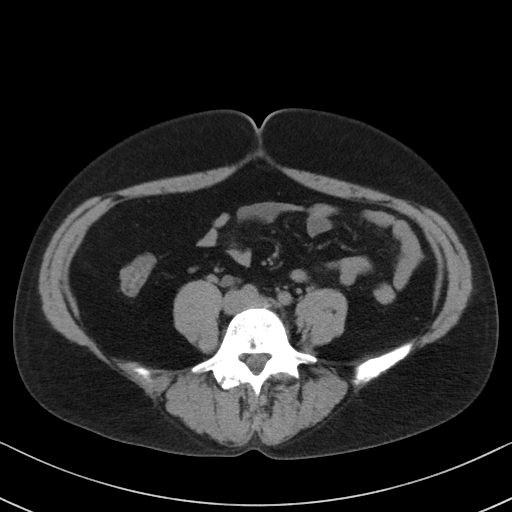
[im 47/91  soft-tissue]
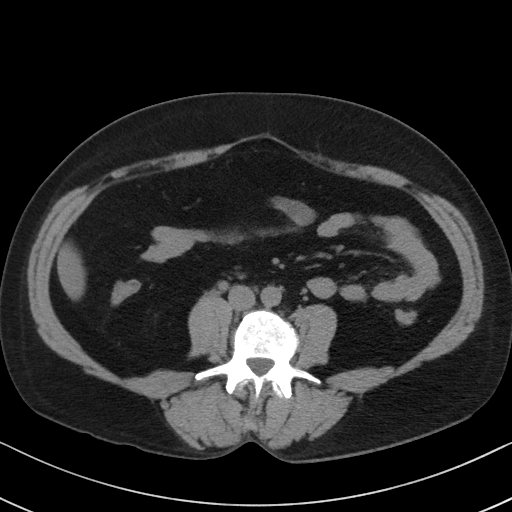
[im 51/91  soft-tissue]
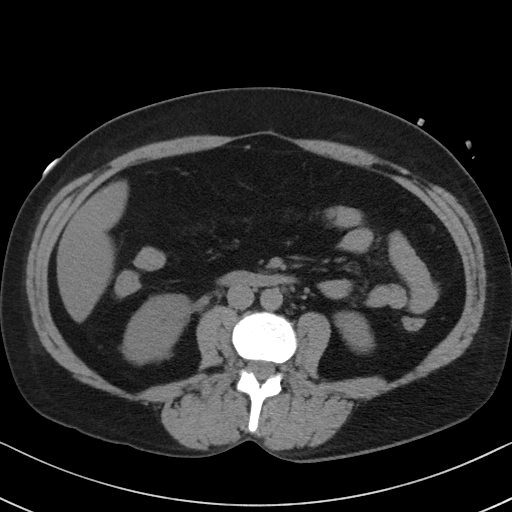
[im 59/91  soft-tissue]
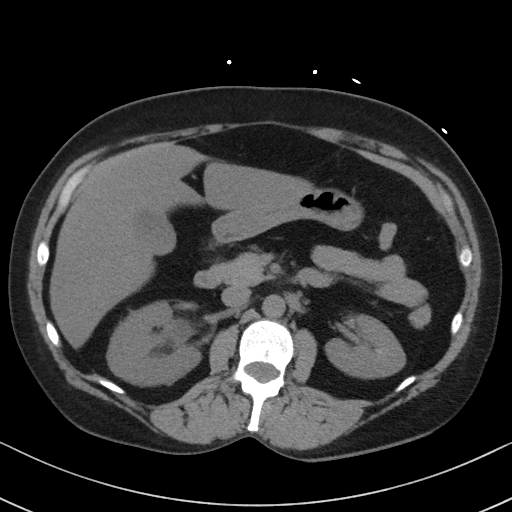
[im 59/91  bone]
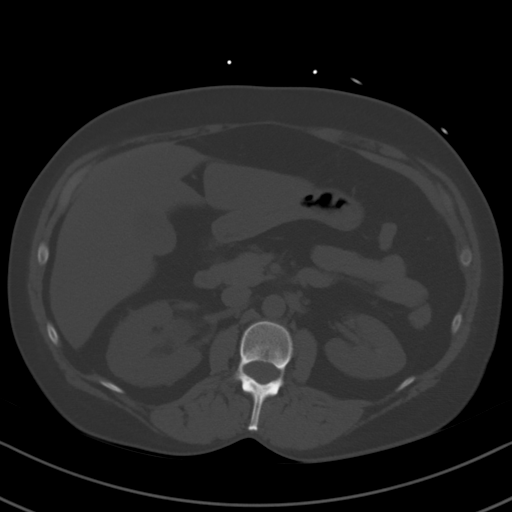
[im 67/91  soft-tissue]
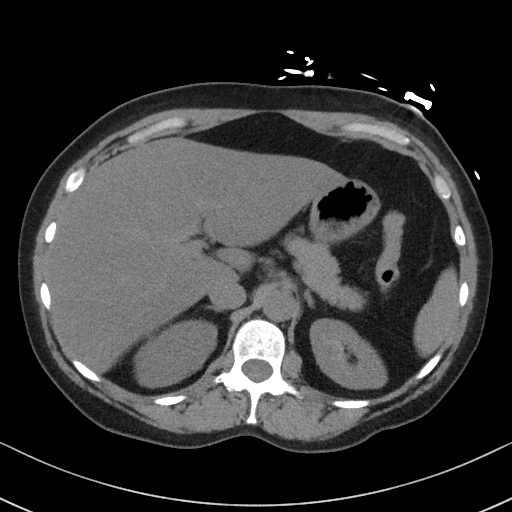
[im 71/91  soft-tissue]
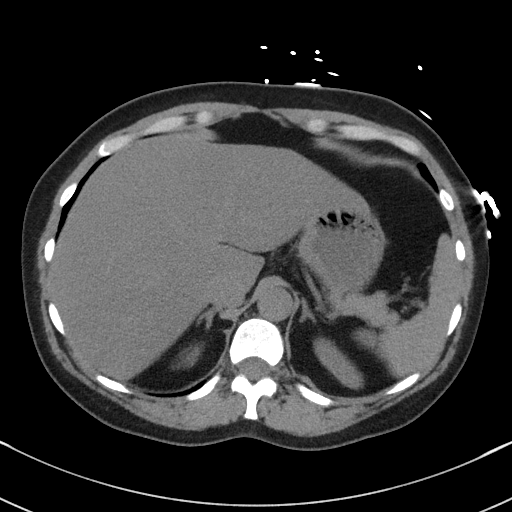
[im 79/91  soft-tissue]
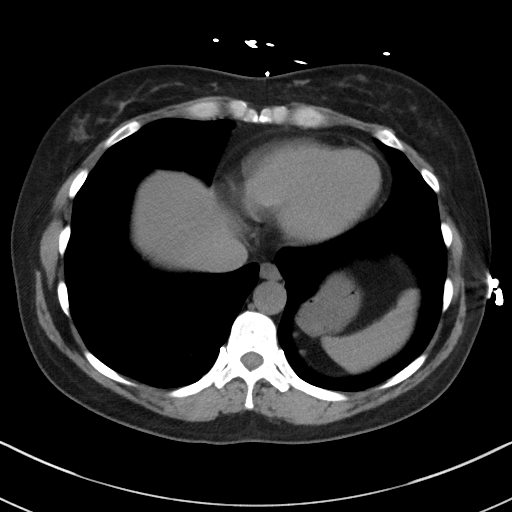
[im 87/91  soft-tissue]
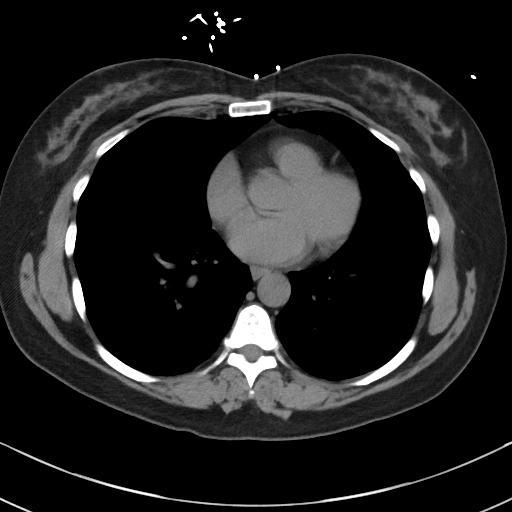

[Series 5: coronal st · coronal · 0.68mm/px · 3 of 90 slices shown]
[im 30/90  soft-tissue]
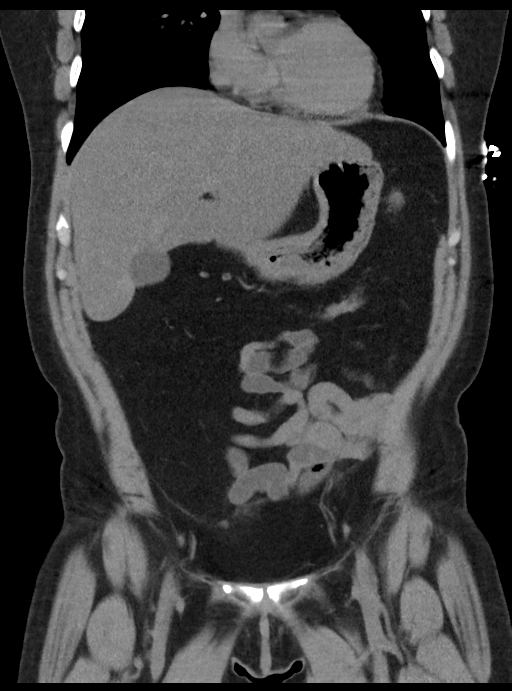
[im 40/90  soft-tissue]
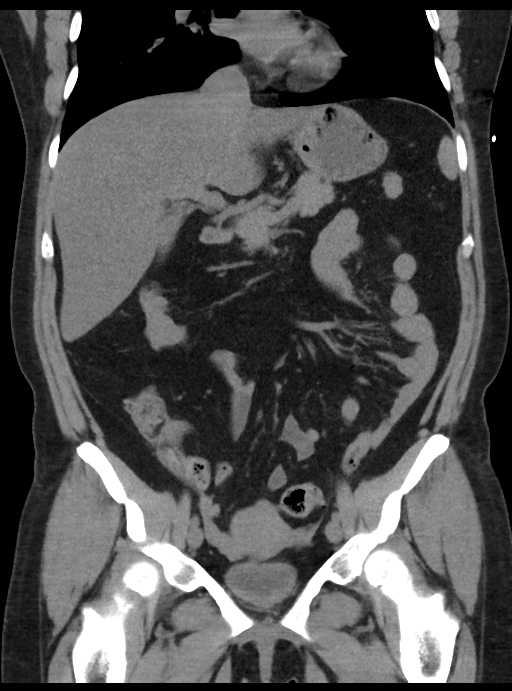
[im 50/90  soft-tissue]
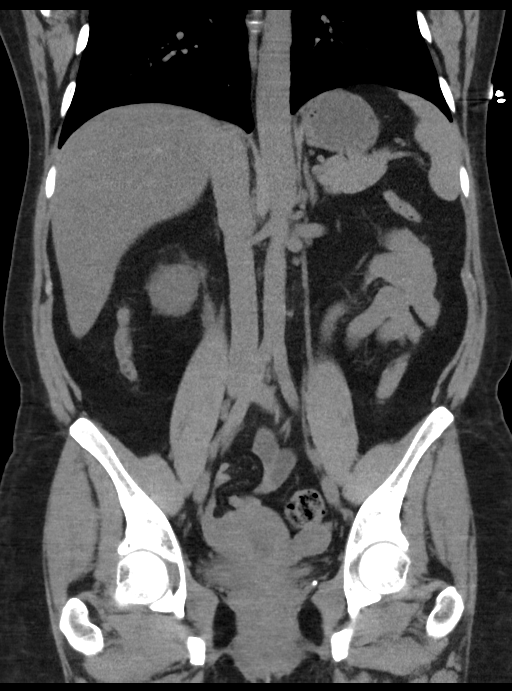

[16 of 46 positions shown; findings below may reference images not displayed]

FINDINGS: Lower chest: No acute findings.

Hepatobiliary: No mass visualized on this unenhanced exam. Mild to
moderate diffuse hepatic steatosis. Gallbladder is unremarkable.

Pancreas: No mass or inflammatory process visualized on this
unenhanced exam.

Spleen:  Within normal limits in size.

Adrenals/Urinary tract: Tiny 1-2 mm calculus in the midpole of the
right kidney. Mild right hydroureteronephrosis is seen due to a 3 mm
calculus at the right ureterovesical junction.

Stomach/Bowel: No evidence of obstruction, inflammatory process, or
abnormal fluid collections. Normal appendix visualized.

Vascular/Lymphatic: No pathologically enlarged lymph nodes
identified. No evidence of abdominal aortic aneurysm.

Reproductive:  No mass or other significant abnormality.

Other:  None.

Musculoskeletal:  No suspicious bone lesions identified.
IMPRESSION: Mild right hydroureteronephrosis due to 3 mm calculus at the right
ureterovesical junction.

Tiny right intrarenal calculus.

Hepatic steatosis.

## 2019-08-30 ENCOUNTER — Encounter: Payer: Self-pay | Admitting: Certified Nurse Midwife

## 2019-12-01 DIAGNOSIS — L738 Other specified follicular disorders: Secondary | ICD-10-CM | POA: Diagnosis not present

## 2019-12-01 DIAGNOSIS — L820 Inflamed seborrheic keratosis: Secondary | ICD-10-CM | POA: Diagnosis not present

## 2019-12-15 DIAGNOSIS — L308 Other specified dermatitis: Secondary | ICD-10-CM | POA: Diagnosis not present

## 2019-12-15 DIAGNOSIS — L820 Inflamed seborrheic keratosis: Secondary | ICD-10-CM | POA: Diagnosis not present

## 2020-04-26 DIAGNOSIS — D1801 Hemangioma of skin and subcutaneous tissue: Secondary | ICD-10-CM | POA: Diagnosis not present

## 2020-04-26 DIAGNOSIS — L821 Other seborrheic keratosis: Secondary | ICD-10-CM | POA: Diagnosis not present

## 2020-04-26 DIAGNOSIS — L308 Other specified dermatitis: Secondary | ICD-10-CM | POA: Diagnosis not present

## 2020-07-27 ENCOUNTER — Encounter: Payer: Self-pay | Admitting: Family Medicine

## 2020-07-27 DIAGNOSIS — D509 Iron deficiency anemia, unspecified: Secondary | ICD-10-CM

## 2020-07-27 DIAGNOSIS — Z1329 Encounter for screening for other suspected endocrine disorder: Secondary | ICD-10-CM

## 2020-07-27 DIAGNOSIS — E785 Hyperlipidemia, unspecified: Secondary | ICD-10-CM

## 2020-07-27 DIAGNOSIS — Z79899 Other long term (current) drug therapy: Secondary | ICD-10-CM

## 2020-07-27 DIAGNOSIS — I10 Essential (primary) hypertension: Secondary | ICD-10-CM

## 2020-07-28 NOTE — Telephone Encounter (Signed)
Yes please - thank you!

## 2020-07-28 NOTE — Addendum Note (Signed)
Addended by: Vicente Males on: 07/28/2020 11:14 AM   Modules accepted: Orders

## 2020-07-28 NOTE — Telephone Encounter (Signed)
Nurses All of the labs that were ordered to be done before the next visit was ordered in October of these orders are still valid.  Patient may do the blood work at her convenience.  I would recommend a follow-up office visit in March.  This would allow [place] to recheck how she is doing plus also discussed the lab work in detail thank you

## 2020-07-31 DIAGNOSIS — I10 Essential (primary) hypertension: Secondary | ICD-10-CM | POA: Diagnosis not present

## 2020-07-31 DIAGNOSIS — E785 Hyperlipidemia, unspecified: Secondary | ICD-10-CM | POA: Diagnosis not present

## 2020-07-31 DIAGNOSIS — Z79899 Other long term (current) drug therapy: Secondary | ICD-10-CM | POA: Diagnosis not present

## 2020-07-31 DIAGNOSIS — D509 Iron deficiency anemia, unspecified: Secondary | ICD-10-CM | POA: Diagnosis not present

## 2020-08-01 LAB — BASIC METABOLIC PANEL
BUN/Creatinine Ratio: 15 (ref 9–23)
BUN: 10 mg/dL (ref 6–24)
CO2: 23 mmol/L (ref 20–29)
Calcium: 9.6 mg/dL (ref 8.7–10.2)
Chloride: 104 mmol/L (ref 96–106)
Creatinine, Ser: 0.67 mg/dL (ref 0.57–1.00)
GFR calc Af Amer: 119 mL/min/{1.73_m2} (ref >59)
GFR calc non Af Amer: 104 mL/min/{1.73_m2} (ref >59)
Glucose: 118 mg/dL — ABNORMAL HIGH (ref 65–99)
Potassium: 4.3 mmol/L (ref 3.5–5.2)
Sodium: 143 mmol/L (ref 134–144)

## 2020-08-01 LAB — TSH+FREE T4
Free T4: 1.02 ng/dL (ref 0.82–1.77)
TSH: 3.65 u[IU]/mL (ref 0.450–4.500)

## 2020-08-01 LAB — LIPID PANEL
Chol/HDL Ratio: 6.9 ratio — ABNORMAL HIGH (ref 0.0–4.4)
Cholesterol, Total: 229 mg/dL — ABNORMAL HIGH (ref 100–199)
HDL: 33 mg/dL — ABNORMAL LOW (ref >39)
LDL Chol Calc (NIH): 164 mg/dL — ABNORMAL HIGH (ref 0–99)
Triglycerides: 171 mg/dL — ABNORMAL HIGH (ref 0–149)
VLDL Cholesterol Cal: 32 mg/dL (ref 5–40)

## 2020-08-01 LAB — FERRITIN: Ferritin: 85 ng/mL (ref 15–150)

## 2020-08-01 LAB — MAGNESIUM: Magnesium: 1.9 mg/dL (ref 1.6–2.3)

## 2020-08-30 DIAGNOSIS — L308 Other specified dermatitis: Secondary | ICD-10-CM | POA: Diagnosis not present

## 2020-08-30 DIAGNOSIS — L820 Inflamed seborrheic keratosis: Secondary | ICD-10-CM | POA: Diagnosis not present

## 2020-10-05 ENCOUNTER — Encounter: Payer: Self-pay | Admitting: Family Medicine

## 2020-11-15 DIAGNOSIS — Z20822 Contact with and (suspected) exposure to covid-19: Secondary | ICD-10-CM | POA: Diagnosis not present

## 2020-11-15 DIAGNOSIS — Z03818 Encounter for observation for suspected exposure to other biological agents ruled out: Secondary | ICD-10-CM | POA: Diagnosis not present

## 2020-12-11 DIAGNOSIS — R030 Elevated blood-pressure reading, without diagnosis of hypertension: Secondary | ICD-10-CM | POA: Diagnosis not present

## 2020-12-11 DIAGNOSIS — S5001XA Contusion of right elbow, initial encounter: Secondary | ICD-10-CM | POA: Diagnosis not present

## 2020-12-11 DIAGNOSIS — S0990XA Unspecified injury of head, initial encounter: Secondary | ICD-10-CM | POA: Diagnosis not present

## 2020-12-12 ENCOUNTER — Encounter: Payer: Self-pay | Admitting: Family Medicine

## 2020-12-12 DIAGNOSIS — M47812 Spondylosis without myelopathy or radiculopathy, cervical region: Secondary | ICD-10-CM | POA: Diagnosis not present

## 2020-12-12 DIAGNOSIS — M25511 Pain in right shoulder: Secondary | ICD-10-CM | POA: Diagnosis not present
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
## 2020-12-13 NOTE — Telephone Encounter (Signed)
Nurses  Currently my schedule is overbooked for this week I could see her with an open slot next week  Or she could see Dr. Lovena Le on Friday morning  Please connect with patient and see what she would like to do thank you

## 2020-12-13 NOTE — Telephone Encounter (Signed)
When would u like to work her in schedule

## 2020-12-20 ENCOUNTER — Ambulatory Visit: Payer: BC Managed Care – PPO | Admitting: Family Medicine

## 2021-05-09 ENCOUNTER — Emergency Department (HOSPITAL_COMMUNITY)
Admission: EM | Admit: 2021-05-09 | Discharge: 2021-05-09 | Disposition: A | Discharge status: Home / Self Care | Payer: BC Managed Care – PPO | Attending: Emergency Medicine | Admitting: Emergency Medicine

## 2021-05-09 ENCOUNTER — Emergency Department (HOSPITAL_COMMUNITY): Payer: BC Managed Care – PPO

## 2021-05-09 ENCOUNTER — Other Ambulatory Visit: Payer: Self-pay

## 2021-05-09 ENCOUNTER — Telehealth: Payer: Self-pay | Admitting: Family Medicine

## 2021-05-09 ENCOUNTER — Encounter (HOSPITAL_COMMUNITY): Payer: Self-pay

## 2021-05-09 DIAGNOSIS — M2578 Osteophyte, vertebrae: Secondary | ICD-10-CM | POA: Diagnosis not present

## 2021-05-09 DIAGNOSIS — M542 Cervicalgia: Secondary | ICD-10-CM | POA: Diagnosis not present

## 2021-05-09 DIAGNOSIS — R200 Anesthesia of skin: Secondary | ICD-10-CM | POA: Insufficient documentation

## 2021-05-09 DIAGNOSIS — Z79899 Other long term (current) drug therapy: Secondary | ICD-10-CM | POA: Diagnosis not present

## 2021-05-09 DIAGNOSIS — R0789 Other chest pain: Secondary | ICD-10-CM | POA: Diagnosis not present

## 2021-05-09 DIAGNOSIS — R531 Weakness: Secondary | ICD-10-CM | POA: Diagnosis not present

## 2021-05-09 DIAGNOSIS — M4802 Spinal stenosis, cervical region: Secondary | ICD-10-CM | POA: Diagnosis not present

## 2021-05-09 DIAGNOSIS — I10 Essential (primary) hypertension: Secondary | ICD-10-CM | POA: Diagnosis not present

## 2021-05-09 DIAGNOSIS — R000 Tachycardia, unspecified: Secondary | ICD-10-CM | POA: Diagnosis not present

## 2021-05-09 DIAGNOSIS — M5412 Radiculopathy, cervical region: Secondary | ICD-10-CM | POA: Insufficient documentation

## 2021-05-09 DIAGNOSIS — Z87891 Personal history of nicotine dependence: Secondary | ICD-10-CM | POA: Insufficient documentation

## 2021-05-09 DIAGNOSIS — R079 Chest pain, unspecified: Secondary | ICD-10-CM | POA: Diagnosis not present

## 2021-05-09 DIAGNOSIS — M546 Pain in thoracic spine: Secondary | ICD-10-CM | POA: Insufficient documentation

## 2021-05-09 DIAGNOSIS — M25512 Pain in left shoulder: Secondary | ICD-10-CM

## 2021-05-09 DIAGNOSIS — M25519 Pain in unspecified shoulder: Secondary | ICD-10-CM | POA: Diagnosis not present

## 2021-05-09 DIAGNOSIS — M4312 Spondylolisthesis, cervical region: Secondary | ICD-10-CM | POA: Diagnosis not present

## 2021-05-09 DIAGNOSIS — M50322 Other cervical disc degeneration at C5-C6 level: Secondary | ICD-10-CM | POA: Diagnosis not present

## 2021-05-09 LAB — CBC WITH DIFFERENTIAL/PLATELET
Abs Immature Granulocytes: 0.07 10*3/uL (ref 0.00–0.07)
Basophils Absolute: 0.1 10*3/uL (ref 0.0–0.1)
Basophils Relative: 1 %
Eosinophils Absolute: 0.2 10*3/uL (ref 0.0–0.5)
Eosinophils Relative: 2 %
HCT: 42.4 % (ref 36.0–46.0)
Hemoglobin: 13.6 g/dL (ref 12.0–15.0)
Immature Granulocytes: 1 %
Lymphocytes Relative: 34 %
Lymphs Abs: 3.4 10*3/uL (ref 0.7–4.0)
MCH: 28.5 pg (ref 26.0–34.0)
MCHC: 32.1 g/dL (ref 30.0–36.0)
MCV: 88.7 fL (ref 80.0–100.0)
Monocytes Absolute: 0.8 10*3/uL (ref 0.1–1.0)
Monocytes Relative: 8 %
Neutro Abs: 5.3 10*3/uL (ref 1.7–7.7)
Neutrophils Relative %: 54 %
Platelets: 301 10*3/uL (ref 150–400)
RBC: 4.78 MIL/uL (ref 3.87–5.11)
RDW: 13.6 % (ref 11.5–15.5)
WBC: 9.8 10*3/uL (ref 4.0–10.5)
nRBC: 0 % (ref 0.0–0.2)

## 2021-05-09 LAB — BASIC METABOLIC PANEL
Anion gap: 10 (ref 5–15)
BUN: 13 mg/dL (ref 6–20)
CO2: 18 mmol/L — ABNORMAL LOW (ref 22–32)
Calcium: 8.7 mg/dL — ABNORMAL LOW (ref 8.9–10.3)
Chloride: 109 mmol/L (ref 98–111)
Creatinine, Ser: 0.64 mg/dL (ref 0.44–1.00)
GFR, Estimated: >60 mL/min (ref >60)
Glucose, Bld: 179 mg/dL — ABNORMAL HIGH (ref 70–99)
Potassium: 3.7 mmol/L (ref 3.5–5.1)
Sodium: 137 mmol/L (ref 135–145)

## 2021-05-09 LAB — TROPONIN I (HIGH SENSITIVITY)
Troponin I (High Sensitivity): <2 ng/L (ref <18)
Troponin I (High Sensitivity): <2 ng/L (ref <18)

## 2021-05-09 LAB — CBG MONITORING, ED: Glucose-Capillary: 181 mg/dL — ABNORMAL HIGH (ref 70–99)

## 2021-05-09 MED ORDER — ASPIRIN 81 MG PO CHEW
324.0000 mg | CHEWABLE_TABLET | Freq: Once | ORAL | Status: AC
  Administered 2021-05-09: 324 mg via ORAL
  Filled 2021-05-09: qty 4

## 2021-05-09 MED ORDER — ONDANSETRON HCL 4 MG/2ML IJ SOLN
4.0000 mg | Freq: Once | INTRAMUSCULAR | Status: AC
  Administered 2021-05-09: 4 mg via INTRAVENOUS
  Filled 2021-05-09: qty 2

## 2021-05-09 MED ORDER — HYDROMORPHONE HCL 1 MG/ML IJ SOLN
1.0000 mg | Freq: Once | INTRAMUSCULAR | Status: AC
  Administered 2021-05-09: 1 mg via INTRAVENOUS
  Filled 2021-05-09: qty 1

## 2021-05-09 MED ORDER — MORPHINE SULFATE (PF) 4 MG/ML IV SOLN
4.0000 mg | Freq: Once | INTRAVENOUS | Status: AC
  Administered 2021-05-09: 4 mg via INTRAVENOUS
  Filled 2021-05-09: qty 1

## 2021-05-09 MED ORDER — PREDNISONE 20 MG PO TABS: 40.0000 mg | ORAL_TABLET | Freq: Every day | ORAL | 0 refills | Status: DC

## 2021-05-09 MED ORDER — KETOROLAC TROMETHAMINE 30 MG/ML IJ SOLN
15.0000 mg | Freq: Once | INTRAMUSCULAR | Status: AC
  Administered 2021-05-09: 15 mg via INTRAVENOUS
  Filled 2021-05-09: qty 1

## 2021-05-09 MED ORDER — METHYL SALICYLATE-LIDO-MENTHOL 4-4-5 % EX PTCH: 1.0000 | MEDICATED_PATCH | Freq: Two times a day (BID) | CUTANEOUS | 0 refills | Status: DC

## 2021-05-09 MED ORDER — SODIUM CHLORIDE 0.9 % IV SOLN
12.5000 mg | Freq: Once | INTRAVENOUS | Status: AC
  Administered 2021-05-09: 12.5 mg via INTRAVENOUS
  Filled 2021-05-09 (×2): qty 0.5

## 2021-05-09 MED ORDER — DEXAMETHASONE SODIUM PHOSPHATE 10 MG/ML IJ SOLN
10.0000 mg | Freq: Once | INTRAMUSCULAR | Status: AC
  Administered 2021-05-09: 10 mg via INTRAVENOUS
  Filled 2021-05-09: qty 1

## 2021-05-09 NOTE — Telephone Encounter (Signed)
She will need to do a follow-up office visit May send in Zofran 8 mg 1 taken 3 times daily as needed 12.  With 2 refills Recommend follow-up office visit tomorrow afternoon

## 2021-05-09 NOTE — ED Triage Notes (Signed)
Rcems from home with cc of worsening left shoulder pain since yesterday.  Took tylenol around 0200   18g r ac

## 2021-05-09 NOTE — ED Provider Notes (Addendum)
9:34 AM Patient in no distress.  I reviewed her MRI results.  Suggestive of radiculopathy, no other acute findings.  Patient is aware of this, comfortable with discharge, Ortho follow-up   Carmin Muskrat, MD 05/09/21 0934  10:54 AM Patient requests additional meds.  Methyl salicylate lidocaine patch is prescribed   Carmin Muskrat, MD 05/09/21 1054

## 2021-05-09 NOTE — Telephone Encounter (Signed)
Patient is at ER this morning with left shoulder and requesting pain medication and something for nausea. She was told to contact primary care to get prescription. Barry Please advise

## 2021-05-09 NOTE — ED Notes (Signed)
Patient transported to MRI 

## 2021-05-09 NOTE — ED Provider Notes (Signed)
[facility] Provider Note   CSN: [id number] Arrival date & time: 05/09/21  0445     History Chief Complaint  Patient presents with   Shoulder Pain    left    Samantha Moreno is a 50 y.o. female.  Patient presents with severe left shoulder pain.  Symptoms started 2 days ago progressively worsening.  Denies any fall or trauma.  She has been taking Tylenol and ibuprofen at home without relief.  Pain is to her left upper back and shoulder and radiates down her left arm.  There is associated pins-and-needles feeling in her left arm and the sensation feels different than the other side. States it feels "asleep".  She does have some weakness in the arm and grip strength and difficulty raising it up.  She also complains of chest palpation and movement.  No shortness of breath, cough or fever.  No vomiting.  Denies any cardiac history.  Denies any blood thinner use. She is never had this kind of pain before.  Denies any history of neck pain or back pain.  Did not receive any medications for EMS. She reports the pain is mostly to her left upper trapezius and rhomboid area and radiates down her left arm causing sensory changes and weakness on the arm.   The history is provided by the patient.  Shoulder Pain Associated symptoms: neck pain   Associated symptoms: no fever       Past Medical History:  Diagnosis Date   Gestational diabetes    Headache    HTN (hypertension)    Nocturnal leg cramps 03/22/2019    Patient Active Problem List   Diagnosis Date Noted   Nocturnal leg cramps 03/22/2019   Dyslipidemia 03/24/2017   LLQ pain 11/28/2016   Nausea with vomiting 11/28/2016   Diarrhea 11/28/2016   Perimenopause 11/17/2016   Iron deficiency anemia 10/28/2016   Vitamin D deficiency 10/28/2016   Essential hypertension 10/26/2016   Other chest pain 04/24/2016   Hyperlipidemia 03/21/2016   Fasting hyperglycemia 03/21/2016    Past Surgical History:  Procedure  Laterality Date   COLONOSCOPY N/A 11/22/2016   Dr. Gala Romney: Distal 5 cm of terminal ileum normal. Diverticulosis in the sigmoid colon and descending colon.   ESOPHAGOGASTRODUODENOSCOPY N/A 11/22/2016   Dr. Gala Romney: H. pylori gastritis.   exploratory laparoscopy  1992/1993   abdominal pain after second child    GIVENS CAPSULE STUDY N/A 11/26/2016   Procedure: GIVENS CAPSULE STUDY;  Surgeon: Daneil Dolin, MD;  Location: [facility];  Service: Endoscopy;  Laterality: N/A;  700     OB History     Gravida  4   Para  3   Term  3   Preterm      AB  1   Living  3      SAB  1   IAB      Ectopic      Multiple      Live Births              Family History  Problem Relation Age of Onset   Stroke Mother    Heart disease Mother    Stroke Father    Diabetes Father    Hypertension Father    Prostate cancer Father    Heart attack Sister    Colon cancer Neg Hx    Colon polyps Neg Hx     Social History   Tobacco Use   Smoking status: Former  Types: Cigarettes    Quit date: 03/20/1991    Years since quitting: 30.1   Smokeless tobacco: Never   Tobacco comments:    smoke for 2 years around age 39  Vaping Use   Vaping Use: Never used  Substance Use Topics   Alcohol use: Yes    Comment: occasionally   Drug use: No    Home Medications Prior to Admission medications   Medication Sig Start Date End Date Taking? Authorizing Provider  clarithromycin (BIAXIN) 500 MG tablet Take 1 tablet (500 mg total) by mouth 2 (two) times daily. Patient not taking: Reported on 03/22/2019 07/13/18   Mikey Kirschner, MD  cyclobenzaprine (FLEXERIL) 5 MG tablet 1 qhs prn 03/24/17   Kathyrn Drown, MD  HYDROcodone-acetaminophen (NORCO/VICODIN) 5-325 MG tablet Take 1 tablet by mouth every 6 (six) hours as needed (pain). 03/22/19   Kathyrn Drown, MD  ibuprofen (ADVIL,MOTRIN) 200 MG tablet Take 200 mg by mouth every 6 (six) hours as needed.    [provider]  metoprolol  succinate (TOPROL-XL) 25 MG 24 hr tablet Take 1 tablet (25 mg total) by mouth daily. 03/22/19   Kathyrn Drown, MD  nitroGLYCERIN (NITROSTAT) 0.4 MG SL tablet Place 1 tablet under the tongue every 5 (five) minutes as needed for chest pain.  11/06/16   [provider]  promethazine (PHENERGAN) 25 MG tablet Take 1 tablet (25 mg total) by mouth every 6 (six) hours as needed for nausea or vomiting. 07/09/18   Kathyrn Drown, MD    Allergies    Levaquin [levofloxacin in d5w], Amlodipine, and Augmentin [amoxicillin-pot clavulanate]  Review of Systems   Review of Systems  Constitutional:  Negative for activity change, appetite change and fever.  HENT:  Negative for congestion and rhinorrhea.   Respiratory:  Negative for cough, chest tightness and shortness of breath.   Cardiovascular:  Positive for chest pain.  Gastrointestinal:  Negative for abdominal pain, nausea and vomiting.  Genitourinary:  Negative for dysuria and hematuria.  Musculoskeletal:  Positive for arthralgias, myalgias and neck pain.  Skin:  Negative for pallor.  Neurological:  Negative for dizziness, weakness and headaches.   all other systems are negative except as noted in the HPI and PMH.   Physical Exam Updated Vital Signs BP (!) 184/105   Pulse 75   Temp 97.6 F (36.4 C) (Oral)   Resp 11   Ht 5\' 2"  (1.575 m)   Wt 59.9 kg   LMP 09/07/2017   SpO2 99%   BMI 24.14 kg/m   Physical Exam Vitals and nursing note reviewed.  Constitutional:      General: She is not in acute distress.    Appearance: She is well-developed.  HENT:     Head: Normocephalic and atraumatic.     Mouth/Throat:     Pharynx: No oropharyngeal exudate.  Eyes:     Conjunctiva/sclera: Conjunctivae normal.     Pupils: Pupils are equal, round, and reactive to light.  Neck:     Comments: No meningismus. Cardiovascular:     Rate and Rhythm: Normal rate and regular rhythm.     Heart sounds: Normal heart sounds. No murmur heard. Pulmonary:      Effort: Pulmonary effort is normal. No respiratory distress.     Breath sounds: Normal breath sounds.  Abdominal:     Palpations: Abdomen is soft.     Tenderness: There is no abdominal tenderness. There is no guarding or rebound.  Musculoskeletal:  General: Tenderness present. Normal range of motion.     Cervical back: Normal range of motion and neck supple.     Comments: TTP upper back and rhomboid area.  Intact radial pulse and cardinal hand movements.   Decreased grip strength on the left.  Difficulty holding left arm up against gravity due to pain. Decreased sensation on the left compared to right side  Skin:    General: Skin is warm.  Neurological:     Mental Status: She is alert and oriented to person, place, and time.     Cranial Nerves: No cranial nerve deficit.     Motor: No abnormal muscle tone.     Coordination: Coordination normal.     Comments:  5/5 strength throughout. CN 2-12 intact.Equal grip strength.   Psychiatric:        Behavior: Behavior normal.    ED Results / Procedures / Treatments   Labs (all labs ordered are listed, but only abnormal results are displayed) Labs Reviewed  BASIC METABOLIC PANEL - Abnormal; Notable for the following components:      Result Value   CO2 18 (*)    Glucose, Bld 179 (*)    Calcium 8.7 (*)    All other components within normal limits  CBG MONITORING, ED - Abnormal; Notable for the following components:   Glucose-Capillary 181 (*)    All other components within normal limits  CBC WITH DIFFERENTIAL/PLATELET  TROPONIN I (HIGH SENSITIVITY)  TROPONIN I (HIGH SENSITIVITY)    EKG EKG Interpretation  Date/Time:  Wednesday May 09 2021 04:51:23 EST Ventricular Rate:  76 PR Interval:  143 QRS Duration: 97 QT Interval:  390 QTC Calculation: 439 R Axis:   -37 Text Interpretation: Sinus rhythm Left axis deviation RSR' in V1 or V2, probably normal variant Borderline T wave abnormalities Baseline wander in lead(s)  II III aVR aVF T waves now upright laterally Confirmed by Ezequiel Essex [phone]) on 05/09/2021 5:09:19 AM  Radiology DG Chest 2 View  Result Date: 05/09/2021 CLINICAL DATA:  50 year old female with chest pain, weakness, upper back pain, left shoulder and arm pain and numbness. EXAM: CHEST - 2 VIEW COMPARISON:  Chest radiographs 03/26/2017 and earlier. FINDINGS: Normal lung volumes and mediastinal contours. Visualized tracheal air column is within normal limits. Both lungs appear stable and clear. No pneumothorax or pleural effusion. No acute osseous abnormality identified. Subtle thoracic scoliosis. Negative visible bowel gas. IMPRESSION: Negative.  No cardiopulmonary abnormality. Electronically Signed   By: Genevie Ann M.D.   On: 05/09/2021 06:10   DG Cervical Spine Complete  Result Date: 05/09/2021 CLINICAL DATA:  Left shoulder and arm pain/weakness. EXAM: CERVICAL SPINE - COMPLETE 4+ VIEW COMPARISON:  None. FINDINGS: No fracture or subluxation. Loss of disc height with endplate spurring noted C5-6. Oblique films show mild bilateral foraminal encroachment at C5-6 secondary to uncinate spurring. No prevertebral soft tissue swelling. IMPRESSION: No acute bony abnormality. Degenerative disc disease at C5-6 with mild bilateral foraminal encroachment secondary to uncinate spurring. Electronically Signed   By: Misty Stanley M.D.   On: 05/09/2021 06:11    Procedures Procedures   Medications Ordered in ED Medications  morphine 4 MG/ML injection 4 mg (has no administration in time range)  aspirin chewable tablet 324 mg (has no administration in time range)    ED Course  I have reviewed the triage vital signs and the nursing notes.  Pertinent labs & imaging results that were available during my care of the patient were reviewed by  me and considered in my medical decision making (see chart for details).    MDM Rules/Calculators/A&P                          Left shoulder pain with weakness and  numbness in arm.  Pulses intact, cardinal hand movements intact.  EKG without acute ischemia.  Chest x-ray is negative.  Troponin negative x1.  Suspect her pain is likely due to cervical radiculopathy.  She does have significant weakness and numbness in her left arm and pain that radiates from her left neck down to her shoulder and arm.  She does endorse chest pain but this seems to be coming from her shoulder and is worse with palpation and movement.  X-ray shows some degenerative disc disease at C5/C6. Developed some vomiting after pain medication.  Plan to obtain MRI given her weakness and numbness in the arm.  Second troponin pending at shift change. Dose of steroids given.  Care transferred at shift change to Dr. Vanita Panda.  Final Clinical Impression(s) / ED Diagnoses Final diagnoses:  None    Rx / DC Orders ED Discharge Orders     None        Elo Marmolejos, Annie Main, MD 05/09/21 [id number]

## 2021-05-09 NOTE — Discharge Instructions (Signed)
As discussed, your evaluation today has been largely reassuring.  But, it is important that you monitor your condition carefully, and do not hesitate to return to the ED if you develop new, or concerning changes in your condition. ? ?Otherwise, please follow-up with your physician for appropriate ongoing care. ? ?

## 2021-05-09 NOTE — ED Notes (Signed)
Pharmacy called at this time for phenergan.

## 2021-05-09 NOTE — Telephone Encounter (Signed)
Patient has been informed according to drs notes and recommendations. Appt offered and scheduled for tomorrow .

## 2021-05-10 ENCOUNTER — Encounter: Payer: Self-pay | Admitting: Family Medicine

## 2021-05-10 ENCOUNTER — Other Ambulatory Visit: Payer: Self-pay | Admitting: Family Medicine

## 2021-05-10 ENCOUNTER — Telehealth: Payer: Self-pay | Admitting: Family Medicine

## 2021-05-10 ENCOUNTER — Ambulatory Visit: Payer: BC Managed Care – PPO | Admitting: Family Medicine

## 2021-05-10 VITALS — BP 142/80 | Temp 96.8°F | Wt 140.4 lb

## 2021-05-10 DIAGNOSIS — M5412 Radiculopathy, cervical region: Secondary | ICD-10-CM

## 2021-05-10 DIAGNOSIS — G959 Disease of spinal cord, unspecified: Secondary | ICD-10-CM | POA: Diagnosis not present

## 2021-05-10 DIAGNOSIS — E041 Nontoxic single thyroid nodule: Secondary | ICD-10-CM | POA: Diagnosis not present

## 2021-05-10 DIAGNOSIS — E559 Vitamin D deficiency, unspecified: Secondary | ICD-10-CM

## 2021-05-10 MED ORDER — OXYCODONE-ACETAMINOPHEN 10-325 MG PO TABS: ORAL_TABLET | ORAL | 0 refills | Status: DC

## 2021-05-10 MED ORDER — OXYCODONE-ACETAMINOPHEN 5-325 MG PO TABS: 1.0000 | ORAL_TABLET | ORAL | 0 refills | Status: AC | PRN

## 2021-05-10 MED ORDER — PROMETHAZINE HCL 25 MG PO TABS: ORAL_TABLET | ORAL | 1 refills | Status: DC

## 2021-05-10 NOTE — Progress Notes (Signed)
   Subjective:    Patient ID: Samantha Moreno, female    DOB: 08-27-1970, 50 y.o.   MRN: [id number]  HPI Pt here for left shoulder pain. Pt did go to ER yesterday. Pt states she has pinched nerved and spine issues. Pt had to call EMS Tuesday night due to severe pain. Gave lidocaine patch but not helping. Has not started Prednisone yet. Pt had pain med from previous script and that did help. Having issues with tingling in left arm.   She relates the feeling in the left arm as well as some weakness. She was not having this problem prior to the discomfort that her over the past 7 days. She denies any injury to her neck She states she has in the past had some level of neck and shoulder discomfort but nothing like this Review of Systems     Objective:   Physical Exam Subjective discomfort in the left trapezius left shoulder and subjective numbness tingling down the left arm skin warm dry Strength interosseous muscles 4 abduction as well as abduction as well as wrist flexors and finger flexors are weak in the left arm Biceps tendon reflex normal Brachialis reflex diminished on the left  MRI reviewed     Assessment & Plan:  Cervical root impingement Cervical myelopathy Weakness in the left arm Finishing up prednisone Prescribed oxycodone for pain-utilize 1/2 tablet first if this does not do enough may utilize a whole tablet-pain medication is not intended for long-term use patient was cautioned regarding this no driving with the pain medicine Phenergan for nausea Caution drowsiness Urgent referral to neurosurgery.  Neurosurgery was paged  Also thyroid nodule recommend ultrasound  Recent lab work with hypocalcemia check lab work Because of state law we had to go change the pain medicine from 10 mg to 5 mg 1 every 4 hours as needed pain caution drowsiness  Also may use Phenergan when necessary for nausea caution drowsiness

## 2021-05-10 NOTE — Telephone Encounter (Signed)
New pain prescription was sent in that would go along with the state guidelines regarding pain medicine Please let the patient know about this change it is Percocet 5 mg 1 every 4 hours as needed for pain caution drowsiness It was due to the new state guidelines regarding opioids that I had to change a prescription for this acute

## 2021-05-10 NOTE — Telephone Encounter (Signed)
Pt contacted and verbalized understanding.  

## 2021-05-10 NOTE — Telephone Encounter (Signed)
Amanda from [facility] calling in regards to pt Oxycodone script. [facility] states that since pt has no history of opioid use, under new guidelines, they can only fill 50 MME and with the Oxycodone 10-325 that is 90 MME.[facility] states provider could send in oxycodone 5-325 and would be able to keep the directions. Please advise. Thank you.

## 2021-05-11 ENCOUNTER — Other Ambulatory Visit: Payer: Self-pay | Admitting: Family Medicine

## 2021-05-11 ENCOUNTER — Other Ambulatory Visit: Payer: Self-pay

## 2021-05-11 LAB — VITAMIN D 25 HYDROXY (VIT D DEFICIENCY, FRACTURES): Vit D, 25-Hydroxy: 16.6 ng/mL — ABNORMAL LOW (ref 30.0–100.0)

## 2021-05-11 LAB — BASIC METABOLIC PANEL
BUN/Creatinine Ratio: 19 (ref 9–23)
BUN: 13 mg/dL (ref 6–24)
CO2: 25 mmol/L (ref 20–29)
Calcium: 9.9 mg/dL (ref 8.7–10.2)
Chloride: 102 mmol/L (ref 96–106)
Creatinine, Ser: 0.7 mg/dL (ref 0.57–1.00)
Glucose: 131 mg/dL — ABNORMAL HIGH (ref 70–99)
Potassium: 4.2 mmol/L (ref 3.5–5.2)
Sodium: 142 mmol/L (ref 134–144)
eGFR: 105 mL/min/{1.73_m2} (ref >59)

## 2021-05-11 MED ORDER — VITAMIN D (ERGOCALCIFEROL) 1.25 MG (50000 UNIT) PO CAPS: ORAL_CAPSULE | ORAL | 1 refills | Status: AC

## 2021-05-12 ENCOUNTER — Encounter: Payer: Self-pay | Admitting: Family Medicine

## 2021-05-14 NOTE — Addendum Note (Signed)
Addended by: Dairl Ponder on: 05/14/2021 02:56 PM   Modules accepted: Orders

## 2021-05-15 DIAGNOSIS — M542 Cervicalgia: Secondary | ICD-10-CM | POA: Diagnosis not present

## 2021-05-15 DIAGNOSIS — M4712 Other spondylosis with myelopathy, cervical region: Secondary | ICD-10-CM | POA: Diagnosis not present

## 2021-05-18 NOTE — Telephone Encounter (Signed)
Nurses  Hopefully at this point Samantha Moreno is met with the surgeon who hopefully has explain some of these issues. If she would like my input regarding this I am more than happy to talk with her via phone but it would need to be near lunchtime or if that is not possible near the end of the day I would prefer for it to be on my schedule so that I can properly document Thanks-Dr. Nicki Reaper

## 2021-05-18 NOTE — Telephone Encounter (Signed)
In my opinion this is serious.  She was having not only discomfort in the arm but also weakness.  The typical approach to something like this is to get it corrected as soon as possible to try to minimize the chance of permanent loss.

## 2021-05-18 NOTE — Telephone Encounter (Signed)
Patient stated she did talk to the neurosurgeon Dr Arnoldo Morale but what she needs to know is how serious this is and if she needs surgery ASAP

## 2021-05-21 ENCOUNTER — Telehealth: Payer: Self-pay | Admitting: Family Medicine

## 2021-05-21 NOTE — Telephone Encounter (Signed)
Nurses-please connect with patient. (Try to find out her concerns please, which surgeon was going to be doing her surgery?)

## 2021-05-21 NOTE — Telephone Encounter (Signed)
Patient called requesting Dr. Nicki Reaper to call her. She only said that it was about the spine surgery.  CB# [phone]

## 2021-05-22 NOTE — Telephone Encounter (Signed)
I did send patient a MyChart message thank you

## 2021-05-23 ENCOUNTER — Ambulatory Visit (HOSPITAL_COMMUNITY)
Admission: RE | Admit: 2021-05-23 | Discharge: 2021-05-23 | Disposition: A | Discharge status: Home / Self Care | Payer: BC Managed Care – PPO | Source: Ambulatory Visit | Attending: Family Medicine | Admitting: Family Medicine

## 2021-05-23 ENCOUNTER — Other Ambulatory Visit: Payer: Self-pay

## 2021-05-23 DIAGNOSIS — E041 Nontoxic single thyroid nodule: Secondary | ICD-10-CM | POA: Insufficient documentation

## 2021-05-24 ENCOUNTER — Encounter: Payer: Self-pay | Admitting: Family Medicine

## 2021-05-24 DIAGNOSIS — Z1329 Encounter for screening for other suspected endocrine disorder: Secondary | ICD-10-CM

## 2021-05-24 NOTE — Telephone Encounter (Signed)
Nurses may order TSH free T4, T3

## 2021-05-25 ENCOUNTER — Ambulatory Visit (HOSPITAL_COMMUNITY): Payer: BC Managed Care – PPO

## 2021-05-30 DIAGNOSIS — M4802 Spinal stenosis, cervical region: Secondary | ICD-10-CM | POA: Diagnosis not present

## 2021-05-30 DIAGNOSIS — M4722 Other spondylosis with radiculopathy, cervical region: Secondary | ICD-10-CM | POA: Diagnosis not present

## 2021-05-30 DIAGNOSIS — M4712 Other spondylosis with myelopathy, cervical region: Secondary | ICD-10-CM | POA: Diagnosis not present

## 2021-06-06 DIAGNOSIS — M4712 Other spondylosis with myelopathy, cervical region: Secondary | ICD-10-CM | POA: Diagnosis not present

## 2021-06-07 ENCOUNTER — Telehealth: Payer: Self-pay | Admitting: Family Medicine

## 2021-06-07 ENCOUNTER — Encounter (HOSPITAL_COMMUNITY): Payer: Self-pay

## 2021-06-07 ENCOUNTER — Emergency Department (HOSPITAL_COMMUNITY): Payer: BC Managed Care – PPO

## 2021-06-07 ENCOUNTER — Emergency Department (HOSPITAL_COMMUNITY)
Admission: EM | Admit: 2021-06-07 | Discharge: 2021-06-07 | Disposition: A | Discharge status: Home / Self Care | Payer: BC Managed Care – PPO | Attending: Emergency Medicine | Admitting: Emergency Medicine

## 2021-06-07 ENCOUNTER — Other Ambulatory Visit: Payer: Self-pay

## 2021-06-07 DIAGNOSIS — K760 Fatty (change of) liver, not elsewhere classified: Secondary | ICD-10-CM | POA: Diagnosis not present

## 2021-06-07 DIAGNOSIS — R079 Chest pain, unspecified: Secondary | ICD-10-CM | POA: Diagnosis not present

## 2021-06-07 DIAGNOSIS — Z87891 Personal history of nicotine dependence: Secondary | ICD-10-CM | POA: Insufficient documentation

## 2021-06-07 DIAGNOSIS — I10 Essential (primary) hypertension: Secondary | ICD-10-CM | POA: Insufficient documentation

## 2021-06-07 DIAGNOSIS — R000 Tachycardia, unspecified: Secondary | ICD-10-CM | POA: Insufficient documentation

## 2021-06-07 DIAGNOSIS — Z79899 Other long term (current) drug therapy: Secondary | ICD-10-CM | POA: Insufficient documentation

## 2021-06-07 DIAGNOSIS — M96843 Postprocedural seroma of a musculoskeletal structure following other procedure: Secondary | ICD-10-CM

## 2021-06-07 DIAGNOSIS — M96842 Postprocedural seroma of a musculoskeletal structure following a musculoskeletal system procedure: Secondary | ICD-10-CM | POA: Insufficient documentation

## 2021-06-07 LAB — CBC WITH DIFFERENTIAL/PLATELET
Abs Immature Granulocytes: 0.04 10*3/uL (ref 0.00–0.07)
Basophils Absolute: 0.1 10*3/uL (ref 0.0–0.1)
Basophils Relative: 1 %
Eosinophils Absolute: 0.1 10*3/uL (ref 0.0–0.5)
Eosinophils Relative: 2 %
HCT: 40.3 % (ref 36.0–46.0)
Hemoglobin: 13.1 g/dL (ref 12.0–15.0)
Immature Granulocytes: 0 %
Lymphocytes Relative: 31 %
Lymphs Abs: 2.9 10*3/uL (ref 0.7–4.0)
MCH: 28.7 pg (ref 26.0–34.0)
MCHC: 32.5 g/dL (ref 30.0–36.0)
MCV: 88.4 fL (ref 80.0–100.0)
Monocytes Absolute: 1 10*3/uL (ref 0.1–1.0)
Monocytes Relative: 11 %
Neutro Abs: 5 10*3/uL (ref 1.7–7.7)
Neutrophils Relative %: 55 %
Platelets: 335 10*3/uL (ref 150–400)
RBC: 4.56 MIL/uL (ref 3.87–5.11)
RDW: 13.4 % (ref 11.5–15.5)
WBC: 9.1 10*3/uL (ref 4.0–10.5)
nRBC: 0 % (ref 0.0–0.2)

## 2021-06-07 LAB — BASIC METABOLIC PANEL
Anion gap: 9 (ref 5–15)
BUN: 8 mg/dL (ref 6–20)
CO2: 26 mmol/L (ref 22–32)
Calcium: 8.9 mg/dL (ref 8.9–10.3)
Chloride: 102 mmol/L (ref 98–111)
Creatinine, Ser: 0.61 mg/dL (ref 0.44–1.00)
GFR, Estimated: >60 mL/min (ref >60)
Glucose, Bld: 121 mg/dL — ABNORMAL HIGH (ref 70–99)
Potassium: 3.9 mmol/L (ref 3.5–5.1)
Sodium: 137 mmol/L (ref 135–145)

## 2021-06-07 LAB — TROPONIN I (HIGH SENSITIVITY)
Troponin I (High Sensitivity): 4 ng/L (ref <18)
Troponin I (High Sensitivity): 4 ng/L (ref <18)

## 2021-06-07 LAB — TSH: TSH: 3.426 u[IU]/mL (ref 0.350–4.500)

## 2021-06-07 LAB — T4, FREE: Free T4: 0.61 ng/dL (ref 0.61–1.12)

## 2021-06-07 MED ORDER — IOHEXOL 350 MG/ML SOLN
100.0000 mL | Freq: Once | INTRAVENOUS | Status: AC | PRN
  Administered 2021-06-07: 14:00:00 100 mL via INTRAVENOUS

## 2021-06-07 MED ORDER — LABETALOL HCL 5 MG/ML IV SOLN
20.0000 mg | Freq: Once | INTRAVENOUS | Status: AC
  Administered 2021-06-07: 13:00:00 20 mg via INTRAVENOUS
  Filled 2021-06-07: qty 4

## 2021-06-07 MED ORDER — METOPROLOL SUCCINATE ER 25 MG PO TB24: 25.0000 mg | ORAL_TABLET | Freq: Every day | ORAL | 1 refills | Status: DC

## 2021-06-07 NOTE — Telephone Encounter (Signed)
Pt contacted office and states that she had surgery last week on her neck. The last few times pt has checked blood pressure it has been high (152/130) (152/120) and heart rate has been 125-128. Pt having cold and hot flashes and some left sided chest pain. Pt has metoprolol on list but has not been taking it because blood pressure has been good. Pt states during surgery her blood pressure "sky rocketed" and heart rate "plummeted". Pt advised to go to ER; pt states Dr.Jenkins, who did the surgery, doesn't want her to go to ER. Advised pt she could go to Urgent Care but there is a possibility they may send her to ER. Pt verbalized understanding and will go to ER.

## 2021-06-07 NOTE — ED Triage Notes (Signed)
Patient complaining of hypertension. Did have neck surgery 8 days prior.

## 2021-06-07 NOTE — ED Notes (Signed)
Patient transported to CT 

## 2021-06-07 NOTE — ED Provider Notes (Signed)
[place] Provider Note  CSN: [id number] Arrival date & time: 06/07/21 1021    History Chief Complaint  Patient presents with   Hypertension    Samantha Moreno is a 50 y.o. female with prior history of HTN, no longer on medications had a neck surgery (?ACDF) about a week ago as an outpatient. She states the procedure was complicated by high BP and low HR but she does not know the details. She spent the day at the surgery center but was not admitted. Since then she has been having increased BP and elevated HR. She has not started taking steroids prescribed after the surgery. She has a known thyroid nodule and was scheduled by PCP for thyroid function tests but has not had them drawn yet. She reports some mild-moderate aching left sided chest pains since the surgery she does no know if she had chest compressions or not.    Past Medical History:  Diagnosis Date   Gestational diabetes    Headache    HTN (hypertension)    Nocturnal leg cramps 03/22/2019    Past Surgical History:  Procedure Laterality Date   COLONOSCOPY N/A 11/22/2016   Dr. Gala Romney: Distal 5 cm of terminal ileum normal. Diverticulosis in the sigmoid colon and descending colon.   ESOPHAGOGASTRODUODENOSCOPY N/A 11/22/2016   Dr. Gala Romney: H. pylori gastritis.   exploratory laparoscopy  1992/1993   abdominal pain after second child    GIVENS CAPSULE STUDY N/A 11/26/2016   Procedure: GIVENS CAPSULE STUDY;  Surgeon: Daneil Dolin, MD;  Location: [facility];  Service: Endoscopy;  Laterality: N/A;  24    Family History  Problem Relation Age of Onset   Stroke Mother    Heart disease Mother    Stroke Father    Diabetes Father    Hypertension Father    Prostate cancer Father    Heart attack Sister    Colon cancer Neg Hx    Colon polyps Neg Hx     Social History   Tobacco Use   Smoking status: Former    Types: Cigarettes    Quit date: 03/20/1991    Years since quitting: 30.2    Smokeless tobacco: Never   Tobacco comments:    smoke for 2 years around age 59  Vaping Use   Vaping Use: Never used  Substance Use Topics   Alcohol use: Yes    Comment: occasionally   Drug use: No     Home Medications Prior to Admission medications   Medication Sig Start Date End Date Taking? Authorizing Provider  acetaminophen (TYLENOL) 325 MG tablet Take 650 mg by mouth every 6 (six) hours as needed for mild pain or moderate pain.    [provider]  ibuprofen (ADVIL,MOTRIN) 200 MG tablet Take 200 mg by mouth every 6 (six) hours as needed.    [provider]  Methyl Salicylate-Lido-Menthol 4-4-5 % PTCH Apply 1 patch topically in the morning and at bedtime. 05/09/21   Carmin Muskrat, MD  metoprolol succinate (TOPROL-XL) 25 MG 24 hr tablet Take 1 tablet (25 mg total) by mouth daily. 06/07/21   Truddie Hidden, MD  nitroGLYCERIN (NITROSTAT) 0.4 MG SL tablet Place 1 tablet under the tongue every 5 (five) minutes as needed for chest pain. 11/06/16   [provider]  predniSONE (DELTASONE) 20 MG tablet Take 2 tablets (40 mg total) by mouth daily with breakfast. For the next four days 05/09/21   Carmin Muskrat, MD  promethazine (  PHENERGAN) 25 MG tablet Take 1 tablet (25 mg total) by mouth every 6 (six) hours as needed for nausea or vomiting. 07/09/18   Kathyrn Drown, MD  promethazine (PHENERGAN) 25 MG tablet Take 1/2 tablet po q 6hrs prn nausea 05/10/21   Kathyrn Drown, MD  Vitamin D, Ergocalciferol, (DRISDOL) 1.25 MG (50000 UNIT) CAPS capsule Take one tablet po each week for next 8 weeks 05/11/21   Kathyrn Drown, MD     Allergies    Levaquin [levofloxacin in d5w], Amlodipine, and Augmentin [amoxicillin-pot clavulanate]   Review of Systems   Review of Systems A comprehensive review of systems was completed and negative except as noted in HPI.    Physical Exam BP (!) 141/100    Pulse (!) 110    Temp 98.5 F (36.9 C) (Oral)    Resp 15    Ht 5\' 2"   (1.575 m)    Wt 61.2 kg    LMP 09/07/2017    SpO2 98%    BMI 24.69 kg/m   Physical Exam Vitals and nursing note reviewed.  Constitutional:      Appearance: Normal appearance.  HENT:     Head: Normocephalic and atraumatic.     Nose: Nose normal.     Mouth/Throat:     Mouth: Mucous membranes are moist.  Eyes:     Extraocular Movements: Extraocular movements intact.     Conjunctiva/sclera: Conjunctivae normal.  Neck:     Comments: Surgical incision is CDI Cardiovascular:     Rate and Rhythm: Tachycardia present.  Pulmonary:     Effort: Pulmonary effort is normal.     Breath sounds: Normal breath sounds.  Chest:     Chest wall: Tenderness present.  Abdominal:     General: Abdomen is flat.     Palpations: Abdomen is soft.     Tenderness: There is no abdominal tenderness.  Musculoskeletal:        General: No swelling. Normal range of motion.     Cervical back: Neck supple.  Skin:    General: Skin is warm and dry.  Neurological:     General: No focal deficit present.     Mental Status: She is alert.  Psychiatric:        Mood and Affect: Mood normal.     ED Results / Procedures / Treatments   Labs (all labs ordered are listed, but only abnormal results are displayed) Labs Reviewed  BASIC METABOLIC PANEL - Abnormal; Notable for the following components:      Result Value   Glucose, Bld 121 (*)    All other components within normal limits  CBC WITH DIFFERENTIAL/PLATELET  TSH  T4, FREE  T3  TROPONIN I (HIGH SENSITIVITY)  TROPONIN I (HIGH SENSITIVITY)    EKG EKG Interpretation  Date/Time:  Thursday June 07 2021 11:27:00 EST Ventricular Rate:  121 PR Interval:  141 QRS Duration: 92 QT Interval:  335 QTC Calculation: 476 R Axis:   -49 Text Interpretation: Sinus tachycardia LAD, consider left anterior fascicular block RSR' in V1 or V2, right VCD or RVH Since last tracing Rate faster Confirmed by Calvert Cantor [phone]) on 06/07/2021 11:33:39  AM  Radiology DG Chest 2 View  Result Date: 06/07/2021 CLINICAL DATA:  Chest pain.  Recent neck surgery.  Left swelling. EXAM: CHEST - 2 VIEW COMPARISON:  Two-view chest x-ray 05/09/2021 FINDINGS: The heart size is normal. Lungs are clear. No edema or effusion is present. No focal airspace disease present. ACDF  hardware noted. IMPRESSION: No active cardiopulmonary disease. Electronically Signed   By: San Morelle M.D.   On: 06/07/2021 12:01   CT Angio Chest PE W/Cm &/Or Wo Cm  Result Date: 06/07/2021 CLINICAL DATA:  Left chest pain EXAM: CT ANGIOGRAPHY CHEST WITH CONTRAST TECHNIQUE: Multidetector CT imaging of the chest was performed using the standard protocol during bolus administration of intravenous contrast. Multiplanar CT image reconstructions and MIPs were obtained to evaluate the vascular anatomy. CONTRAST:  164mL OMNIPAQUE IOHEXOL 350 MG/ML SOLN COMPARISON:  Chest radiographs done earlier today FINDINGS: Cardiovascular: There is homogeneous enhancement in thoracic aorta. There are no intraluminal filling defects in the pulmonary artery branches. Mediastinum/Nodes: No significant abnormality is seen in the mediastinum. There is 2.2 cm fluid density structure in the prevertebral region at cervicothoracic junction on the left side. There is 1.5 cm low-density lesion in the left lobe of thyroid. There is anterior surgical fusion at C5-C6 and C6-C7 levels. Lungs/Pleura: There is no focal pulmonary consolidation. There is no pleural effusion or pneumothorax. Upper Abdomen: There is fatty infiltration in the liver. Musculoskeletal: Degenerative changes are noted in the lower cervical spine. There is surgical fusion from C5-C7 levels. Review of the MIP images confirms the above findings. IMPRESSION: There is no evidence of pulmonary artery embolism. There is no evidence of thoracic aortic dissection. There is no focal pulmonary consolidation. There is anterior surgical fusion at C5-C6 and C6-C7  levels. There is 2.2 cm loculated fluid density in the left prevertebral space at cervicothoracic junction. This may suggest diverticulum in the cervical esophagus or serous fluid collection related to recent cervical spine surgery. If there are continued symptoms, follow-up CT or MRI may be considered to rule out any active infectious process. There is 1.5 cm low-density structure within the left lobe of thyroid. Follow-up thyroid sonogram may be considered. Fatty liver. Electronically Signed   By: Elmer Picker M.D.   On: 06/07/2021 14:04    Procedures Procedures  Medications Ordered in the ED Medications  labetalol (NORMODYNE) injection 20 mg (20 mg Intravenous Given 06/07/21 1243)  iohexol (OMNIPAQUE) 350 MG/ML injection 100 mL (100 mLs Intravenous Contrast Given 06/07/21 1331)     MDM Rules/Calculators/A&P MDM  Patient here with HTN and tachycardia with reproducible chest pains after neck surgery. Will check basic labs, including thyroid function. May need CTA as well to rule out PE.  ED Course  I have reviewed the triage vital signs and the nursing notes.  Pertinent labs & imaging results that were available during my care of the patient were reviewed by me and considered in my medical decision making (see chart for details).  Clinical Course as of 06/07/21 1528  Thu Jun 07, 2021  1211 CXR is clear. When labs are resulted, if GFR is adequate, will plan CTA chest.  [CS]  1218 CBC is normal.  [CS]  1243 Patient reports that she feels like her lips are swollen. No discernible angioedema of lips or tongue. No stridor.  [CS]  1313 BMP and initial trop are neg. Will send for CTA.  [CS]  1962 TSH is normal.  [CS]  1425 Trop is flat.  [CS]  2297 CTA neg for PE, there is a fluid collection, likely a post op seroma, no signs of infection or airway obstruction. Will discuss with NSurg to ensure no further workup needed and to coordinate appropriate follow up.  For the HTN and  tachycardia, improved with Labetalol. Will restart Toprol, she was on previously. PCP follow  up.  [CS]  1527 Spoke with Dr. Arnoldo Morale who is aware of seroma and will follow up as regularly scheduled.  [CS]    Clinical Course User Index [CS] Truddie Hidden, MD    Final Clinical Impression(s) / ED Diagnoses Final diagnoses:  Hypertension, unspecified type  Postprocedural seroma of a musculoskeletal structure following other procedure    Rx / DC Orders ED Discharge Orders          Ordered    metoprolol succinate (TOPROL-XL) 25 MG 24 hr tablet  Daily        06/07/21 1518             Truddie Hidden, MD 06/07/21 1528

## 2021-06-07 NOTE — ED Notes (Signed)
Patient stated her lip was swelling and her voice sounded "more hoarse" than when she first arrived. Dr. Karle Starch notified and he is in room with patient at this time.

## 2021-06-08 ENCOUNTER — Other Ambulatory Visit: Payer: Self-pay

## 2021-06-08 ENCOUNTER — Telehealth: Payer: Self-pay | Admitting: Family Medicine

## 2021-06-08 ENCOUNTER — Emergency Department (HOSPITAL_BASED_OUTPATIENT_CLINIC_OR_DEPARTMENT_OTHER)
Admission: EM | Admit: 2021-06-08 | Discharge: 2021-06-09 | Disposition: A | Discharge status: Home / Self Care | Payer: BC Managed Care – PPO | Attending: Emergency Medicine | Admitting: Emergency Medicine

## 2021-06-08 DIAGNOSIS — R739 Hyperglycemia, unspecified: Secondary | ICD-10-CM | POA: Insufficient documentation

## 2021-06-08 DIAGNOSIS — Z79899 Other long term (current) drug therapy: Secondary | ICD-10-CM | POA: Diagnosis not present

## 2021-06-08 DIAGNOSIS — E1165 Type 2 diabetes mellitus with hyperglycemia: Secondary | ICD-10-CM | POA: Diagnosis not present

## 2021-06-08 DIAGNOSIS — Z7984 Long term (current) use of oral hypoglycemic drugs: Secondary | ICD-10-CM | POA: Insufficient documentation

## 2021-06-08 DIAGNOSIS — R000 Tachycardia, unspecified: Secondary | ICD-10-CM | POA: Diagnosis not present

## 2021-06-08 DIAGNOSIS — I10 Essential (primary) hypertension: Secondary | ICD-10-CM | POA: Diagnosis not present

## 2021-06-08 DIAGNOSIS — Z87891 Personal history of nicotine dependence: Secondary | ICD-10-CM | POA: Diagnosis not present

## 2021-06-08 LAB — URINALYSIS, ROUTINE W REFLEX MICROSCOPIC
Bilirubin Urine: NEGATIVE
Glucose, UA: >1000 mg/dL — AB
Ketones, ur: 15 mg/dL — AB
Leukocytes,Ua: NEGATIVE
Nitrite: NEGATIVE
Protein, ur: NEGATIVE mg/dL
Specific Gravity, Urine: 1.024 (ref 1.005–1.030)
pH: 5.5 (ref 5.0–8.0)

## 2021-06-08 LAB — CBC
HCT: 39.7 % (ref 36.0–46.0)
Hemoglobin: 13 g/dL (ref 12.0–15.0)
MCH: 28 pg (ref 26.0–34.0)
MCHC: 32.7 g/dL (ref 30.0–36.0)
MCV: 85.6 fL (ref 80.0–100.0)
Platelets: 399 10*3/uL (ref 150–400)
RBC: 4.64 MIL/uL (ref 3.87–5.11)
RDW: 13.5 % (ref 11.5–15.5)
WBC: 15 10*3/uL — ABNORMAL HIGH (ref 4.0–10.5)
nRBC: 0 % (ref 0.0–0.2)

## 2021-06-08 LAB — BASIC METABOLIC PANEL
Anion gap: 12 (ref 5–15)
BUN: 11 mg/dL (ref 6–20)
CO2: 23 mmol/L (ref 22–32)
Calcium: 9.5 mg/dL (ref 8.9–10.3)
Chloride: 103 mmol/L (ref 98–111)
Creatinine, Ser: 0.75 mg/dL (ref 0.44–1.00)
GFR, Estimated: >60 mL/min (ref >60)
Glucose, Bld: 272 mg/dL — ABNORMAL HIGH (ref 70–99)
Potassium: 4.2 mmol/L (ref 3.5–5.1)
Sodium: 138 mmol/L (ref 135–145)

## 2021-06-08 LAB — T3: T3, Total: 173 ng/dL (ref 71–180)

## 2021-06-08 LAB — CBG MONITORING, ED: Glucose-Capillary: 262 mg/dL — ABNORMAL HIGH (ref 70–99)

## 2021-06-08 NOTE — ED Provider Notes (Signed)
[facility] DEPT Provider Note   CSN: [id number] Arrival date & time: 06/08/21  2207     History Chief Complaint  Patient presents with   Hypertension    Samantha Moreno is a 50 y.o. female.  50 year old female who has a history of hypertension and recent surgery.  Samantha Moreno states that during the surgery Samantha Moreno has some kind of event where her blood pressure got very high and her heart rate got low but Samantha Moreno is not clear on what happened.  Samantha Moreno states since that time Samantha Moreno has felt unwell.  Samantha Moreno just felt kind of weak had some nausea and just decreased energy.  Samantha Moreno states that Samantha Moreno is not really had a fever.  Samantha Moreno was seen yesterday because Samantha Moreno had some hypertension and Samantha Moreno was told to start back on her metoprolol and via couple discussions with her primary doctor Samantha Moreno will do that.  Samantha Moreno had a CT and labs and work-up yesterday that was overall reassuring but Samantha Moreno checked her blood sugar couple times today and her blood sugars were in the high 200s low 300s.  Samantha Moreno started prednisone this morning.  I reviewed the records it does appear that her blood sugars have been somewhat elevated in the past intermittently but nothing diagnostic of diabetes.  Samantha Moreno states that Samantha Moreno has had polyuria and polydipsia and dry mouth and some intermittent blurry vision that Samantha Moreno did not really realize until Samantha Moreno looked up the signs for diabetes.   Hypertension      Past Medical History:  Diagnosis Date   Gestational diabetes    Headache    HTN (hypertension)    Nocturnal leg cramps 03/22/2019    Patient Active Problem List   Diagnosis Date Noted   Nocturnal leg cramps 03/22/2019   Dyslipidemia 03/24/2017   LLQ pain 11/28/2016   Nausea with vomiting 11/28/2016   Diarrhea 11/28/2016   Perimenopause 11/17/2016   Iron deficiency anemia 10/28/2016   Vitamin D deficiency 10/28/2016   Essential hypertension 10/26/2016   Other chest pain 04/24/2016   Hyperlipidemia 03/21/2016   Fasting  hyperglycemia 03/21/2016    Past Surgical History:  Procedure Laterality Date   COLONOSCOPY N/A 11/22/2016   Dr. Gala Romney: Distal 5 cm of terminal ileum normal. Diverticulosis in the sigmoid colon and descending colon.   ESOPHAGOGASTRODUODENOSCOPY N/A 11/22/2016   Dr. Gala Romney: H. pylori gastritis.   exploratory laparoscopy  1992/1993   abdominal pain after second child    GIVENS CAPSULE STUDY N/A 11/26/2016   Procedure: GIVENS CAPSULE STUDY;  Surgeon: Daneil Dolin, MD;  Location: [facility];  Service: Endoscopy;  Laterality: N/A;  28     OB History     Gravida  4   Para  3   Term  3   Preterm      AB  1   Living  3      SAB  1   IAB      Ectopic      Multiple      Live Births              Family History  Problem Relation Age of Onset   Stroke Mother    Heart disease Mother    Stroke Father    Diabetes Father    Hypertension Father    Prostate cancer Father    Heart attack Sister    Colon cancer Neg Hx    Colon polyps Neg Hx     Social History  Tobacco Use   Smoking status: Former    Types: Cigarettes    Quit date: 03/20/1991    Years since quitting: 30.2   Smokeless tobacco: Never   Tobacco comments:    smoke for 2 years around age 39  Vaping Use   Vaping Use: Never used  Substance Use Topics   Alcohol use: Yes    Comment: occasionally   Drug use: No    Home Medications Prior to Admission medications   Medication Sig Start Date End Date Taking? Authorizing Provider  metFORMIN (GLUCOPHAGE) 500 MG tablet Take 1 tablet (500 mg total) by mouth 2 (two) times daily with a meal. 06/09/21  Yes Julita Ozbun, Corene Cornea, MD  acetaminophen (TYLENOL) 325 MG tablet Take 650 mg by mouth every 6 (six) hours as needed for mild pain or moderate pain.    [provider]  ibuprofen (ADVIL,MOTRIN) 200 MG tablet Take 200 mg by mouth every 6 (six) hours as needed.    [provider]  Methyl Salicylate-Lido-Menthol 4-4-5 % PTCH Apply 1 patch  topically in the morning and at bedtime. 05/09/21   Carmin Muskrat, MD  metoprolol succinate (TOPROL-XL) 25 MG 24 hr tablet Take 2 tablets (50 mg total) by mouth daily. 06/09/21   Azzam Mehra, Corene Cornea, MD  nitroGLYCERIN (NITROSTAT) 0.4 MG SL tablet Place 1 tablet under the tongue every 5 (five) minutes as needed for chest pain. 11/06/16   [provider]  predniSONE (DELTASONE) 20 MG tablet Take 2 tablets (40 mg total) by mouth daily with breakfast. For the next four days 05/09/21   Carmin Muskrat, MD  promethazine (PHENERGAN) 25 MG tablet Take 1 tablet (25 mg total) by mouth every 6 (six) hours as needed for nausea or vomiting. 07/09/18   Kathyrn Drown, MD  promethazine (PHENERGAN) 25 MG tablet Take 1/2 tablet po q 6hrs prn nausea 05/10/21   Kathyrn Drown, MD  Vitamin D, Ergocalciferol, (DRISDOL) 1.25 MG (50000 UNIT) CAPS capsule Take one tablet po each week for next 8 weeks 05/11/21   Kathyrn Drown, MD    Allergies    Levaquin [levofloxacin in d5w], Amlodipine, and Augmentin [amoxicillin-pot clavulanate]  Review of Systems   Review of Systems  All other systems reviewed and are negative.  Physical Exam Updated Vital Signs BP (!) 162/103 (BP Location: Left Arm)    Pulse (!) 109    Temp 98.5 F (36.9 C) (Oral)    Resp 18    Wt 61.2 kg    LMP 09/07/2017    SpO2 99%    BMI 24.68 kg/m   Physical Exam Vitals and nursing note reviewed.  Constitutional:      Appearance: Samantha Moreno is well-developed.  HENT:     Head: Normocephalic and atraumatic.     Nose: No congestion or rhinorrhea.     Mouth/Throat:     Mouth: Mucous membranes are moist.     Pharynx: Oropharynx is clear.  Eyes:     Pupils: Pupils are equal, round, and reactive to light.  Cardiovascular:     Rate and Rhythm: Regular rhythm. Tachycardia present.  Pulmonary:     Effort: No respiratory distress.     Breath sounds: No stridor.  Abdominal:     General: Abdomen is flat. There is no distension.  Musculoskeletal:         General: No swelling, tenderness, deformity or signs of injury. Normal range of motion.     Cervical back: Normal range of motion.  Right lower leg: No edema.     Left lower leg: No edema.  Skin:    General: Skin is warm and dry.  Neurological:     General: No focal deficit present.     Mental Status: Samantha Moreno is alert.    ED Results / Procedures / Treatments   Labs (all labs ordered are listed, but only abnormal results are displayed) Labs Reviewed  BASIC METABOLIC PANEL - Abnormal; Notable for the following components:      Result Value   Glucose, Bld 272 (*)    All other components within normal limits  CBC - Abnormal; Notable for the following components:   WBC 15.0 (*)    All other components within normal limits  URINALYSIS, ROUTINE W REFLEX MICROSCOPIC - Abnormal; Notable for the following components:   Color, Urine COLORLESS (*)    Glucose, UA >1,000 (*)    Hgb urine dipstick TRACE (*)    Ketones, ur 15 (*)    All other components within normal limits  HEMOGLOBIN A1C - Abnormal; Notable for the following components:   Hgb A1c MFr Bld 6.2 (*)    All other components within normal limits  CBG MONITORING, ED - Abnormal; Notable for the following components:   Glucose-Capillary 262 (*)    All other components within normal limits    EKG EKG Interpretation  Date/Time:  Friday June 08 2021 22:26:34 EST Ventricular Rate:  146 PR Interval:  116 QRS Duration: 82 QT Interval:  288 QTC Calculation: 448 R Axis:   267 Text Interpretation: Sinus tachycardia Right superior axis deviation Abnormal ECG When compared with ECG of 07-Jun-2021 11:27, PREVIOUS ECG IS PRESENT Confirmed by Merrily Pew [phone]) on 06/09/2021 5:45:13 AM  Radiology No results found.  Procedures Procedures   Medications Ordered in ED Medications  metoprolol tartrate (LOPRESSOR) injection 5 mg (5 mg Intravenous Given 06/09/21 0007)  lactated ringers bolus 1,000 mL (0 mLs Intravenous Stopped  06/09/21 0118)  lactated ringers bolus 1,000 mL (0 mLs Intravenous Stopped 06/09/21 0407)    ED Course  I have reviewed the triage vital signs and the nursing notes.  Pertinent labs & imaging results that were available during my care of the patient were reviewed by me and considered in my medical decision making (see chart for details).  Clinical Course as of 06/09/21 2229  Fri Jun 08, 2021  2306 Pulse Rate(!): 113 [JM]  2306 BP(!): 222/120 [JM]  2306 Glucose-Capillary(!): 262 [JM]  2306 Glucose, UA(!): >1,000 [JM]  2306 Glucose(!): 272 [JM]  2306 Anion gap: 12 No DKA, seems just hyperglycemia [JM]  2306 WBC(!): 15.0 Recent surgery. Had CTA/labs/workup/ED visit yesterday.  [JM]  Sat Jun 09, 2021  0003 Hyperglycemia previously. Since surgery has been higher. No e/o DKA at this time. No dfeinitive evidence of DM since her CBG's today are after steroid dosing today. Will check HbA1c. Will give fluids and a dose of metoprolol.  [JM]    Clinical Course User Index [JM] Rama Sorci, Corene Cornea, MD   MDM Rules/Calculators/A&P                         A1c resulted after patient left and elevated but not to the point of starting meds. Message sent. Samantha Moreno will follow with cardiology for evaluation of sinus tachycardia and hypertension. Still has follow up with NSG.   Final Clinical Impression(s) / ED Diagnoses Final diagnoses:  Hyperglycemia  Hypertension, unspecified type    Rx /  DC Orders ED Discharge Orders          Ordered    metFORMIN (GLUCOPHAGE) 500 MG tablet  2 times daily with meals        Jun 27, 2021 0620    Ambulatory referral to Cardiology       Comments: Persistent tachycardia and hypertension. Strong family history of sudden cardiac death, some at young age.   2021/06/27 0620    metoprolol succinate (TOPROL-XL) 25 MG 24 hr tablet  Daily        06-27-2021 0620             Hermen Mario, Corene Cornea, MD 06/27/2021 2229

## 2021-06-08 NOTE — ED Triage Notes (Signed)
Pt presents for htn (180/117) and hyperglycemia (328), generalized weakness. Neck surgery Dec 21st, incision appears clean, healing well, and nondraining in triage. No swelling noted. Increased urination No h/o DM, has been taking CBG with meter used when she had gestational DM.  Was seen at [facility] yesterday for same.  Has taken oxycodone and promethazine at 2100.

## 2021-06-08 NOTE — Telephone Encounter (Signed)
Nurses I reviewed over the ER note It does not mention any swelling Not sure when this occurred Also was patient taking the metoprolol in the several days leading up to the ER visit (she had been on it previously )or just restarted it  Based on the situation it is too difficult to figure all of this out via phone call May use amlodipine 2.5 mg 1 daily for blood pressure, #14, no refills, follow-up office visit next week or the week after to recheck Hold off on metoprolol for now

## 2021-06-08 NOTE — Telephone Encounter (Signed)
Patient states she can not take Amlodipine and is ok sticking with the metoprolol at this time. Patient states the swelling of her lips started when thy made her put on a mask and started going away when she took it off and is gone today. Patient stated she will schedule an appt for a follow up office visit.

## 2021-06-08 NOTE — Telephone Encounter (Signed)
Pt called and stated she was in the ED yesterday due to blood pressure. She stated her upper lip swelled up also (not swollen now). She wanted to know if she needed a different blood pressure medication.

## 2021-06-08 NOTE — Telephone Encounter (Signed)
So noted 

## 2021-06-09 ENCOUNTER — Encounter (HOSPITAL_BASED_OUTPATIENT_CLINIC_OR_DEPARTMENT_OTHER): Payer: Self-pay | Admitting: Emergency Medicine

## 2021-06-09 LAB — HEMOGLOBIN A1C
Hgb A1c MFr Bld: 6.2 % — ABNORMAL HIGH (ref 4.8–5.6)
Mean Plasma Glucose: 131.24 mg/dL

## 2021-06-09 MED ORDER — LACTATED RINGERS IV BOLUS
1000.0000 mL | Freq: Once | INTRAVENOUS | Status: AC
  Administered 2021-06-09: 1000 mL via INTRAVENOUS

## 2021-06-09 MED ORDER — METOPROLOL TARTRATE 5 MG/5ML IV SOLN
5.0000 mg | Freq: Once | INTRAVENOUS | Status: AC
  Administered 2021-06-09: 5 mg via INTRAVENOUS
  Filled 2021-06-09: qty 5

## 2021-06-09 MED ORDER — METFORMIN HCL 500 MG PO TABS: 500.0000 mg | ORAL_TABLET | Freq: Two times a day (BID) | ORAL | 1 refills | Status: DC

## 2021-06-09 MED ORDER — METOPROLOL SUCCINATE ER 25 MG PO TB24: 50.0000 mg | ORAL_TABLET | Freq: Every day | ORAL | 1 refills | Status: DC

## 2021-06-09 NOTE — ED Notes (Signed)
Patient assisted to restroom.  

## 2021-06-09 NOTE — Discharge Instructions (Signed)
Your hemoglobin A1c test will result later today.  I will call you if you need to start your metformin otherwise do not start it.  Go ahead and double your metoprolol for now until you follow-up with cardiology or another primary care doctor.  If you are looking for new primary care doctor I have attached a resource guide with different phone numbers in the area.  There is also phone number in this paperwork that you can call to try to get a provider within the [facility] health network as needed.  I have put in a consult for cardiology and their phone number is above that would be separate from her primary care physician.

## 2021-06-11 ENCOUNTER — Encounter: Payer: Self-pay | Admitting: Family Medicine

## 2021-06-12 NOTE — Telephone Encounter (Signed)
Needs office visit this week

## 2021-06-12 NOTE — Telephone Encounter (Signed)
Needs office visit.

## 2021-06-15 ENCOUNTER — Ambulatory Visit: Payer: BC Managed Care – PPO | Admitting: Nurse Practitioner

## 2021-06-15 ENCOUNTER — Other Ambulatory Visit: Payer: Self-pay

## 2021-06-15 ENCOUNTER — Encounter: Payer: Self-pay | Admitting: Nurse Practitioner

## 2021-06-15 VITALS — BP 148/93 | HR 104 | Temp 97.3°F | Ht 62.0 in | Wt 145.0 lb

## 2021-06-15 DIAGNOSIS — R7301 Impaired fasting glucose: Secondary | ICD-10-CM | POA: Diagnosis not present

## 2021-06-15 DIAGNOSIS — I10 Essential (primary) hypertension: Secondary | ICD-10-CM | POA: Diagnosis not present

## 2021-06-15 DIAGNOSIS — R000 Tachycardia, unspecified: Secondary | ICD-10-CM | POA: Diagnosis not present

## 2021-06-15 MED ORDER — PROMETHAZINE HCL 25 MG PO TABS: 25.0000 mg | ORAL_TABLET | Freq: Four times a day (QID) | ORAL | 0 refills | Status: DC | PRN

## 2021-06-15 NOTE — Progress Notes (Signed)
Subjective:    Patient ID: Samantha Moreno, female    DOB: [birth year], 51 y.o.   MRN: [id number]  HPI ER follow up Hypertension and hyperglycemia - recent spine surgery 05/30/21 Metformin d/c' at ER, completed pred., refill promethazine Had 2 ED visits after her spinal surgery the first was 06/07/2021 and the second was 06/08/2021 the first 1 was for elevated blood pressure.  Her BP at that time was running 170/111.  She also had difficulty with her blood pressure and pulse after her surgery.  Patient was started on metoprolol XL 25 mg which was increased to 50 mg due to continued hypertension.  Continues to monitor BP at home which has improved on the increased dose.  Has an appointment with Dr. Johnsie Cancel on 1/24. Patient was on a course of prednisone which she completed yesterday.  Her second ED visit on 06/08/2021 was for elevated blood sugar.  Note that she was on prednisone at that time.  Random blood sugar was 272.  A1c 6.2.  Initially was prescribed metformin but was told not to start this. PMH includes gestational diabetes, essential hypertension, fasting hyperglycemia and tachycardia. [place]: Father diabetes.  Review of Systems  Respiratory:  Negative for cough, chest tightness, shortness of breath and wheezing.   Cardiovascular:  Negative for chest pain and leg swelling.      Objective:   Physical Exam NAD.  Alert, oriented.  Mildly anxious affect.  Lungs clear.  Heart regular rate and rhythm.  No murmur or gallop noted.  Lower extremities no edema.  Today's Vitals   06/15/21 1422  BP: (!) 148/93  Pulse: (!) 104  Temp: (!) 97.3 F (36.3 C)  SpO2: 96%  Weight: 145 lb (65.8 kg)  Height: 5\' 2"  (1.575 m)   Body mass index is 26.52 kg/m. Results for orders placed or performed during the hospital encounter of 29/52/84  Basic metabolic panel  Result Value Ref Range   Sodium 138 135 - 145 mmol/L   Potassium 4.2 3.5 - 5.1 mmol/L   Chloride 103 98 - 111 mmol/L   CO2 23 22 - 32  mmol/L   Glucose, Bld 272 (H) 70 - 99 mg/dL   BUN 11 6 - 20 mg/dL   Creatinine, Ser 0.75 0.44 - 1.00 mg/dL   Calcium 9.5 8.9 - 10.3 mg/dL   GFR, Estimated >60 >60 mL/min   Anion gap 12 5 - 15  CBC  Result Value Ref Range   WBC 15.0 (H) 4.0 - 10.5 K/uL   RBC 4.64 3.87 - 5.11 MIL/uL   Hemoglobin 13.0 12.0 - 15.0 g/dL   HCT 39.7 36.0 - 46.0 %   MCV 85.6 80.0 - 100.0 fL   MCH 28.0 26.0 - 34.0 pg   MCHC 32.7 30.0 - 36.0 g/dL   RDW 13.5 11.5 - 15.5 %   Platelets 399 150 - 400 K/uL   nRBC 0.0 0.0 - 0.2 %  Urinalysis, Routine w reflex microscopic  Result Value Ref Range   Color, Urine COLORLESS (A) YELLOW   APPearance CLEAR CLEAR   Specific Gravity, Urine 1.024 1.005 - 1.030   pH 5.5 5.0 - 8.0   Glucose, UA >1,000 (A) NEGATIVE mg/dL   Hgb urine dipstick TRACE (A) NEGATIVE   Bilirubin Urine NEGATIVE NEGATIVE   Ketones, ur 15 (A) NEGATIVE mg/dL   Protein, ur NEGATIVE NEGATIVE mg/dL   Nitrite NEGATIVE NEGATIVE   Leukocytes,Ua NEGATIVE NEGATIVE   RBC / HPF 0-5 0 - 5 RBC/hpf  WBC, UA 0-5 0 - 5 WBC/hpf   Squamous Epithelial / LPF 0-5 0 - 5   Mucus PRESENT   Hemoglobin A1c  Result Value Ref Range   Hgb A1c MFr Bld 6.2 (H) 4.8 - 5.6 %   Mean Plasma Glucose 131.24 mg/dL  CBG monitoring, ED  Result Value Ref Range   Glucose-Capillary 262 (H) 70 - 99 mg/dL          Assessment & Plan:   Problem List Items Addressed This Visit       Cardiovascular and Mediastinum   Essential hypertension - Primary     Other   Fasting hyperglycemia   Tachycardia   Meds ordered this encounter  Medications   promethazine (PHENERGAN) 25 MG tablet    Sig: Take 1 tablet (25 mg total) by mouth every 6 (six) hours as needed for nausea or vomiting.    Dispense:  30 tablet    Refill:  0    Order Specific Question:   Supervising Provider    Answer:   Sallee Lange A [[id number]]   With patient's history of fasting hyperglycemia, it is likely that the prednisone caused her blood sugar to go very high.   Recommend holding on metformin with repeating A1c in 3 months.  Discussed the importance of a diet low in sugar and simple carbs.  Activity as tolerated postop. While BP and pulse are still elevated and but much improved from previous numbers, continue metoprolol as directed until visit with cardiology.  Continue to monitor BP and pulse outside the office and call back if further elevations. A sample of a freestyle libre to sensor was placed for patient to monitor her blood sugars over the next 2 weeks.  Call back to the office if they remain elevated. Warning signs reviewed.  Follow-up in 3 months for recheck, consider wellness exam. Call back sooner if any problems.

## 2021-06-16 ENCOUNTER — Encounter: Payer: Self-pay | Admitting: Nurse Practitioner

## 2021-06-16 DIAGNOSIS — R000 Tachycardia, unspecified: Secondary | ICD-10-CM | POA: Insufficient documentation

## 2021-06-18 MED ORDER — LOSARTAN POTASSIUM 50 MG PO TABS: ORAL_TABLET | ORAL | 4 refills | Status: DC

## 2021-06-18 NOTE — Telephone Encounter (Signed)
Nurses With Hoyle Sauer being out until Friday-I reviewed over her note as well as ER note.  Metoprolol does a good job helping heart rate from running fast but does not do as much for blood pressure  Given that Linh related to [place] that her numbers are showing significant elevation-I would recommend adding losartan 50 mg 1 daily, #30, 4 refills Continue metoprolol  For the first 4 days I would recommend taking 1/2 tablet/day-if blood pressure readings come back to normal (below 140 on the top, low 90 on the bottom) then stick with that dose.  If numbers remain elevated increase the dose.  Patient has an appointment with cardiology in approximately 3 weeks but we can easily see her back in the office next week if she would like for [place] to recheck her-see what she would like to do  You may share this message with her Thanks-Dr. Nicki Reaper

## 2021-06-22 DIAGNOSIS — M4712 Other spondylosis with myelopathy, cervical region: Secondary | ICD-10-CM | POA: Diagnosis not present

## 2021-06-25 ENCOUNTER — Encounter: Payer: Self-pay | Admitting: Family Medicine

## 2021-06-25 NOTE — Telephone Encounter (Signed)
Nurses (Appointments are full for today I can see her Tuesday Wednesday or Thursday for blood pressure issues.  We will not be able to see the patient today for emergency purposes because the schedule is full.  If she feels she is having an emergency going on she needs to go to the emergency department.  It is hard to know if the coldness and numbness is more of a subjective feeling on the patient's part or if this is a sign of sudden change such as lack of vasculature to the arm.  If her arm is cold to the touch, discolored, purple, no color etc. emergency department for further evaluation.  If it is more of a subjective feeling of coldness with numbness I highly recommend her connecting with her surgeon.)  Please work with the patient regarding the above issues. Thanks-Dr. Nicki Reaper

## 2021-06-29 NOTE — Progress Notes (Signed)
CARDIOLOGY CONSULT NOTE       Patient ID: EMMAMARIE KLUENDER MRN: [id number] DOB/AGE: [birth year] 51 y.o.  Admit date: (Not on file) Referring Physician: Wolfgang Phoenix Primary Physician: Kathyrn Drown, MD Primary Cardiologist: New Reason for Consultation: HTN/Tachycardia/Family history SCD  Active Problems:   * No active hospital problems. *   HPI:  51 y.o. referred by DR Wolfgang Phoenix for HTN with persistent tachycardia and family history of SCD.  She has a history of gestational DM She has been Rx with metoprolol and losartan added on 06/16/2021 She had spine surgery 05/30/21   Cervical fusion C5-7 Elevated HR and BP since postoperative course. She had a course of steroids post op.  Her A1c is 6.2 Hct 39.7 TSH 3.4   ECG in ER showed ST rate 146 06/14/21  Did not appear to be flutter   She states that during the surgery she has some kind of event where her blood pressure got very high and her heart rate got low but she is not clear on what happened.  She states since that time she has felt unwell.  She just felt kind of weak had some nausea and just decreased energy.   She has an older daughter with 3 grand kids in [place] and a home schooled 40 yo Has some coffee/coke but not excessive. No chest pain dyspnea syncope No smoking Or illicit drugs.   ROS All other systems reviewed and negative except as noted above  Past Medical History:  Diagnosis Date   Gestational diabetes    Headache    HTN (hypertension)    Nocturnal leg cramps 03/22/2019    Family History  Problem Relation Age of Onset   Stroke Mother    Heart disease Mother    Stroke Father    Diabetes Father    Hypertension Father    Prostate cancer Father    Heart attack Sister    Colon cancer Neg Hx    Colon polyps Neg Hx     Social History   Socioeconomic History   Marital status: Single    Spouse name: Not on file   Number of children: Not on file   Years of education: Not on file   Highest education level: Not on  file  Occupational History   Occupation: Engineer, maintenance (IT)  Tobacco Use   Smoking status: Former    Types: Cigarettes    Quit date: 03/20/1991    Years since quitting: 30.3   Smokeless tobacco: Never   Tobacco comments:    smoke for 2 years around age 52  Vaping Use   Vaping Use: Never used  Substance and Sexual Activity   Alcohol use: Yes    Comment: occasionally   Drug use: No   Sexual activity: Yes    Partners: Male    Birth control/protection: None  Other Topics Concern   Not on file  Social History Narrative   Not on file   Social Determinants of Health   Financial Resource Strain: Not on file  Food Insecurity: Not on file  Transportation Needs: Not on file  Physical Activity: Not on file  Stress: Not on file  Social Connections: Not on file  Intimate Partner Violence: Not on file    Past Surgical History:  Procedure Laterality Date   COLONOSCOPY N/A 11/22/2016   Dr. Gala Romney: Distal 5 cm of terminal ileum normal. Diverticulosis in the sigmoid colon and descending colon.   ESOPHAGOGASTRODUODENOSCOPY N/A 11/22/2016   Dr. Gala Romney: H.  pylori gastritis.   exploratory laparoscopy  1992/1993   abdominal pain after second child    GIVENS CAPSULE STUDY N/A 11/26/2016   Procedure: GIVENS CAPSULE STUDY;  Surgeon: Daneil Dolin, MD;  Location: [facility];  Service: Endoscopy;  Laterality: N/A;  700      Current Outpatient Medications:    acetaminophen (TYLENOL) 325 MG tablet, Take 650 mg by mouth every 6 (six) hours as needed for mild pain or moderate pain., Disp: , Rfl:    HYDROcodone-acetaminophen (NORCO/VICODIN) 5-325 MG tablet, Take 1-2 tablets by mouth daily., Disp: , Rfl:    ibuprofen (ADVIL,MOTRIN) 200 MG tablet, Take 200 mg by mouth every 6 (six) hours as needed., Disp: , Rfl:    naloxone (NARCAN) nasal spray 4 mg/0.1 mL, as directed., Disp: , Rfl:    promethazine (PHENERGAN) 25 MG tablet, Take 1 tablet (25 mg total) by mouth every 6 (six) hours as needed  for nausea or vomiting., Disp: 30 tablet, Rfl: 0   Vitamin D, Ergocalciferol, (DRISDOL) 1.25 MG (50000 UNIT) CAPS capsule, Take one tablet po each week for next 8 weeks, Disp: 4 capsule, Rfl: 1   losartan (COZAAR) 100 MG tablet, Take 1 tablet (100 mg total) by mouth daily., Disp: 90 tablet, Rfl: 3   metoprolol succinate (TOPROL-XL) 100 MG 24 hr tablet, Take 1 tablet (100 mg total) by mouth daily., Disp: 90 tablet, Rfl: 3    Physical Exam: Blood pressure (!) 140/100, pulse (!) 101, height 5\' 2"  (1.575 m), weight 140 lb (63.5 kg), last menstrual period 09/07/2017, SpO2 99 %.    Affect appropriate Healthy:  appears stated age 51: normal post anterior cervical fusion  Neck supple with no adenopathy JVP normal no bruits no thyromegaly Lungs clear with no wheezing and good diaphragmatic motion Heart:  S1/S2 no murmur, no rub, gallop or click PMI normal Abdomen: benighn, BS positve, no tenderness, no AAA no bruit.  No HSM or HJR Distal pulses intact with no bruits No edema Neuro non-focal Skin warm and dry No muscular weakness   Labs:   Lab Results  Component Value Date   WBC 15.0 (H) 06/08/2021   HGB 13.0 06/08/2021   HCT 39.7 06/08/2021   MCV 85.6 06/08/2021   PLT 399 06/08/2021   No results for input(s): NA, K, CL, CO2, BUN, CREATININE, CALCIUM, PROT, BILITOT, ALKPHOS, ALT, AST, GLUCOSE in the last 168 hours.  Invalid input(s): LABALBU Lab Results  Component Value Date   TROPONINI <0.03 03/26/2017    Lab Results  Component Value Date   CHOL 229 (H) 07/31/2020   CHOL 204 (H) 03/25/2017   CHOL 160 10/25/2016   Lab Results  Component Value Date   HDL 33 (L) 07/31/2020   HDL 30 (L) 03/25/2017   HDL 33 (L) 10/25/2016   Lab Results  Component Value Date   LDLCALC 164 (H) 07/31/2020   LDLCALC 118 (H) 03/25/2017   LDLCALC 90 10/25/2016   Lab Results  Component Value Date   TRIG 171 (H) 07/31/2020   TRIG 280 (H) 03/25/2017   TRIG 185 (H) 10/25/2016   Lab  Results  Component Value Date   CHOLHDL 6.9 (H) 07/31/2020   CHOLHDL 6.8 (H) 03/25/2017   CHOLHDL 4.8 (H) 10/25/2016   No results found for: LDLDIRECT    Radiology: DG Chest 2 View  Result Date: 06/07/2021 CLINICAL DATA:  Chest pain.  Recent neck surgery.  Left swelling. EXAM: CHEST - 2 VIEW COMPARISON:  Two-view chest x-ray 05/09/2021  FINDINGS: The heart size is normal. Lungs are clear. No edema or effusion is present. No focal airspace disease present. ACDF hardware noted. IMPRESSION: No active cardiopulmonary disease. Electronically Signed   By: San Morelle M.D.   On: 06/07/2021 12:01   CT Angio Chest PE W/Cm &/Or Wo Cm  Result Date: 06/07/2021 CLINICAL DATA:  Left chest pain EXAM: CT ANGIOGRAPHY CHEST WITH CONTRAST TECHNIQUE: Multidetector CT imaging of the chest was performed using the standard protocol during bolus administration of intravenous contrast. Multiplanar CT image reconstructions and MIPs were obtained to evaluate the vascular anatomy. CONTRAST:  177mL OMNIPAQUE IOHEXOL 350 MG/ML SOLN COMPARISON:  Chest radiographs done earlier today FINDINGS: Cardiovascular: There is homogeneous enhancement in thoracic aorta. There are no intraluminal filling defects in the pulmonary artery branches. Mediastinum/Nodes: No significant abnormality is seen in the mediastinum. There is 2.2 cm fluid density structure in the prevertebral region at cervicothoracic junction on the left side. There is 1.5 cm low-density lesion in the left lobe of thyroid. There is anterior surgical fusion at C5-C6 and C6-C7 levels. Lungs/Pleura: There is no focal pulmonary consolidation. There is no pleural effusion or pneumothorax. Upper Abdomen: There is fatty infiltration in the liver. Musculoskeletal: Degenerative changes are noted in the lower cervical spine. There is surgical fusion from C5-C7 levels. Review of the MIP images confirms the above findings. IMPRESSION: There is no evidence of pulmonary artery  embolism. There is no evidence of thoracic aortic dissection. There is no focal pulmonary consolidation. There is anterior surgical fusion at C5-C6 and C6-C7 levels. There is 2.2 cm loculated fluid density in the left prevertebral space at cervicothoracic junction. This may suggest diverticulum in the cervical esophagus or serous fluid collection related to recent cervical spine surgery. If there are continued symptoms, follow-up CT or MRI may be considered to rule out any active infectious process. There is 1.5 cm low-density structure within the left lobe of thyroid. Follow-up thyroid sonogram may be considered. Fatty liver. Electronically Signed   By: Elmer Picker M.D.   On: 06/07/2021 14:04    EKG: See HPI   ASSESSMENT AND PLAN:   HTN:  Continue ARB/beta blocker increase doses both. Renal duplex and plasma metanephrine Tachycardia:  sinus in setting of recent spine surgery and steroid use echo to assess EF and 48 hr monitor to check for normal circadian drop  DM:  Discussed low carb diet.  Target hemoglobin A1c is 6.5 or less.  Continue current medications. C-spine:  post C5-7 fusion f/u Jenkins    48 hour holter Urine metanephrines Echo Renal duplex Increase Toprol 100 mg Increase Cozaar 100 mg   Signed: Jenkins Rouge 07/03/2021, 2:43 PM

## 2021-07-03 ENCOUNTER — Other Ambulatory Visit: Payer: Self-pay

## 2021-07-03 ENCOUNTER — Ambulatory Visit: Payer: BC Managed Care – PPO | Admitting: Cardiovascular Disease

## 2021-07-03 ENCOUNTER — Encounter: Payer: Self-pay | Admitting: Cardiovascular Disease

## 2021-07-03 ENCOUNTER — Ambulatory Visit (INDEPENDENT_AMBULATORY_CARE_PROVIDER_SITE_OTHER): Payer: BC Managed Care – PPO

## 2021-07-03 VITALS — BP 140/100 | HR 101 | Ht 62.0 in | Wt 140.0 lb

## 2021-07-03 DIAGNOSIS — E785 Hyperlipidemia, unspecified: Secondary | ICD-10-CM | POA: Diagnosis not present

## 2021-07-03 DIAGNOSIS — R000 Tachycardia, unspecified: Secondary | ICD-10-CM

## 2021-07-03 DIAGNOSIS — I10 Essential (primary) hypertension: Secondary | ICD-10-CM

## 2021-07-03 MED ORDER — LOSARTAN POTASSIUM 100 MG PO TABS: 100.0000 mg | ORAL_TABLET | Freq: Every day | ORAL | 3 refills | Status: AC

## 2021-07-03 MED ORDER — METOPROLOL SUCCINATE ER 100 MG PO TB24: 100.0000 mg | ORAL_TABLET | Freq: Every day | ORAL | 3 refills | Status: AC

## 2021-07-03 NOTE — Progress Notes (Unsigned)
[id number] 3 day zio xt from office inventory applied to patient.

## 2021-07-03 NOTE — Patient Instructions (Signed)
Medication Instructions:  Your physician has recommended you make the following change in your medication:  1-INCREASE Metoprolol 100 mg by mouth daily 2-INCREASE Losartan 100 mg by mouth daily  *If you need a refill on your cardiac medications before your next appointment, please call your pharmacy*  Lab Work: Your physician recommends that you have lab work today- plasma metanephrines  If you have labs (blood work) drawn today and your tests are completely normal, you will receive your results only by: [place] (if you have MyChart) OR A paper copy in the mail If you have any lab test that is abnormal or we need to change your treatment, we will call you to review the results.  Testing/Procedures: Your physician has requested that you have a renal artery duplex. During this test, an ultrasound is used to evaluate blood flow to the kidneys. Allow one hour for this exam. Do not eat after midnight the day before and avoid carbonated beverages. Take your medications as you usually do.  Your physician has requested that you have an echocardiogram. Echocardiography is a painless test that uses sound waves to create images of your heart. It provides your doctor with information about the size and shape of your heart and how well your hearts chambers and valves are working. This procedure takes approximately one hour. There are no restrictions for this procedure.  Your physician has recommended that you wear an event monitor for 3 days. Event monitors are medical devices that record the hearts electrical activity. Doctors most often [place] these monitors to diagnose arrhythmias. Arrhythmias are problems with the speed or rhythm of the heartbeat. The monitor is a small, portable device. You can wear one while you do your normal daily activities. This is usually used to diagnose what is causing palpitations/syncope (passing out).  Follow-Up: At [facility], you and your health needs are our  priority.  As part of our continuing mission to provide you with exceptional heart care, we have created designated Provider Care Teams.  These Care Teams include your primary Cardiologist (physician) and Advanced Practice Providers (APPs -  Physician Assistants and Nurse Practitioners) who all work together to provide you with the care you need, when you need it.  We recommend signing up for the patient portal called "MyChart".  Sign up information is provided on this After Visit Summary.  MyChart is used to connect with patients for Virtual Visits (Telemedicine).  Patients are able to view lab/test results, encounter notes, upcoming appointments, etc.  Non-urgent messages can be sent to your provider as well.   To learn more about what you can do with MyChart, go to [email or web address].    Your next appointment:   1 month(s) in [place]  The format for your next appointment:   In Person  Provider:   Jenkins Rouge, MD {

## 2021-07-07 LAB — METANEPHRINES, PLASMA
Metanephrine, Free: 19.9 pg/mL (ref 0.0–88.0)
Normetanephrine, Free: 85.4 pg/mL (ref 0.0–218.9)

## 2021-07-09 ENCOUNTER — Other Ambulatory Visit: Payer: Self-pay | Admitting: Nurse Practitioner

## 2021-07-17 ENCOUNTER — Other Ambulatory Visit: Payer: Self-pay

## 2021-07-17 ENCOUNTER — Encounter: Payer: Self-pay | Admitting: Cardiovascular Disease

## 2021-07-17 ENCOUNTER — Ambulatory Visit (HOSPITAL_COMMUNITY)
Admission: RE | Admit: 2021-07-17 | Discharge: 2021-07-17 | Disposition: A | Discharge status: Home / Self Care | Payer: BC Managed Care – PPO | Source: Ambulatory Visit | Attending: Cardiovascular Disease | Admitting: Cardiovascular Disease

## 2021-07-17 ENCOUNTER — Telehealth: Payer: Self-pay | Admitting: Cardiovascular Disease

## 2021-07-17 ENCOUNTER — Ambulatory Visit (HOSPITAL_BASED_OUTPATIENT_CLINIC_OR_DEPARTMENT_OTHER): Payer: BC Managed Care – PPO

## 2021-07-17 DIAGNOSIS — R000 Tachycardia, unspecified: Secondary | ICD-10-CM

## 2021-07-17 DIAGNOSIS — E785 Hyperlipidemia, unspecified: Secondary | ICD-10-CM

## 2021-07-17 DIAGNOSIS — I10 Essential (primary) hypertension: Secondary | ICD-10-CM | POA: Diagnosis not present

## 2021-07-17 LAB — ECHOCARDIOGRAM COMPLETE
Area-P 1/2: 4.31 cm2
S' Lateral: 2.5 cm

## 2021-07-17 NOTE — Telephone Encounter (Signed)
Patient states she was returning call

## 2021-07-17 NOTE — Telephone Encounter (Signed)
Patient stated she was returning call to [facility]. [name] did patient's test today. Will send her the message.

## 2021-07-19 NOTE — Progress Notes (Unsigned)
CARDIOLOGY CONSULT NOTE       Patient ID: Samantha Moreno MRN: [id number] DOB/AGE: [birth year] 51 y.o.  Admit date: (Not on file) Referring Physician: Wolfgang Phoenix Primary Physician: Kathyrn Drown, MD Primary Cardiologist: New Reason for Consultation: HTN/Tachycardia/Family history SCD    HPI:  51 y.o. referred by DR Wolfgang Phoenix for HTN with persistent tachycardia and family history of SCD.  She has a history of gestational DM She has been Rx with metoprolol and losartan added on 06/16/2021 She had spine surgery 05/30/21   Cervical fusion C5-7 Elevated HR and BP since postoperative course. She had a course of steroids post op.  Her A1c is 6.2 Hct 39.7 TSH 3.4   ECG in ER showed ST rate 146 06/14/21  Did not appear to be flutter   She states that during the surgery she has some kind of event where her blood pressure got very high and her heart rate got low but she is not clear on what happened.  She states since that time she has felt unwell.  She just felt kind of weak had some nausea and just decreased energy.   She has an older daughter with 3 grand kids in [place] and a home schooled 51 yo Has some coffee/coke but not excessive. No chest pain dyspnea syncope No smoking Or illicit drugs.   Visit 07/03/21 Toprol and Cozaar doses increased Renal duplex with no arterial stenosis  Metanephrines negative  48 hour monitor showed average HR 102 bpm with <1% PAC/PVC    08/06/21 TTE EF 55% no LVH strain -23.5 no valve dx  ***  ROS All other systems reviewed and negative except as noted above  Past Medical History:  Diagnosis Date   Gestational diabetes    Headache    HTN (hypertension)    Nocturnal leg cramps 03/22/2019    Family History  Problem Relation Age of Onset   Stroke Mother    Heart disease Mother    Stroke Father    Diabetes Father    Hypertension Father    Prostate cancer Father    Heart attack Sister    Colon cancer Neg Hx    Colon polyps Neg Hx     Social History    Socioeconomic History   Marital status: Single    Spouse name: Not on file   Number of children: Not on file   Years of education: Not on file   Highest education level: Not on file  Occupational History   Occupation: Engineer, maintenance (IT)  Tobacco Use   Smoking status: Former    Types: Cigarettes    Quit date: 03/20/1991    Years since quitting: 30.3   Smokeless tobacco: Never   Tobacco comments:    smoke for 2 years around age 59  Vaping Use   Vaping Use: Never used  Substance and Sexual Activity   Alcohol use: Yes    Comment: occasionally   Drug use: No   Sexual activity: Yes    Partners: Male    Birth control/protection: None  Other Topics Concern   Not on file  Social History Narrative   Not on file   Social Determinants of Health   Financial Resource Strain: Not on file  Food Insecurity: Not on file  Transportation Needs: Not on file  Physical Activity: Not on file  Stress: Not on file  Social Connections: Not on file  Intimate Partner Violence: Not on file    Past Surgical History:  Procedure Laterality  Date   COLONOSCOPY N/A 11/22/2016   Dr. Gala Romney: Distal 5 cm of terminal ileum normal. Diverticulosis in the sigmoid colon and descending colon.   ESOPHAGOGASTRODUODENOSCOPY N/A 11/22/2016   Dr. Gala Romney: H. pylori gastritis.   exploratory laparoscopy  1992/1993   abdominal pain after second child    GIVENS CAPSULE STUDY N/A 11/26/2016   Procedure: GIVENS CAPSULE STUDY;  Surgeon: Daneil Dolin, MD;  Location: [facility];  Service: Endoscopy;  Laterality: N/A;  700      Current Outpatient Medications:    acetaminophen (TYLENOL) 325 MG tablet, Take 650 mg by mouth every 6 (six) hours as needed for mild pain or moderate pain., Disp: , Rfl:    HYDROcodone-acetaminophen (NORCO/VICODIN) 5-325 MG tablet, Take 1-2 tablets by mouth daily., Disp: , Rfl:    ibuprofen (ADVIL,MOTRIN) 200 MG tablet, Take 200 mg by mouth every 6 (six) hours as needed., Disp: , Rfl:     losartan (COZAAR) 100 MG tablet, Take 1 tablet (100 mg total) by mouth daily., Disp: 90 tablet, Rfl: 3   metoprolol succinate (TOPROL-XL) 100 MG 24 hr tablet, Take 1 tablet (100 mg total) by mouth daily., Disp: 90 tablet, Rfl: 3   naloxone (NARCAN) nasal spray 4 mg/0.1 mL, as directed., Disp: , Rfl:    promethazine (PHENERGAN) 25 MG tablet, TAKE 1 TABLET BY MOUTH EVERY 6 HOURS AS NEEDED FOR NAUSEA OR VOMITING, Disp: 30 tablet, Rfl: 1   Vitamin D, Ergocalciferol, (DRISDOL) 1.25 MG (50000 UNIT) CAPS capsule, Take one tablet po each week for next 8 weeks, Disp: 4 capsule, Rfl: 1    Physical Exam: Last menstrual period 09/07/2017.    Affect appropriate Healthy:  appears stated age 51: normal post anterior cervical fusion  Neck supple with no adenopathy JVP normal no bruits no thyromegaly Lungs clear with no wheezing and good diaphragmatic motion Heart:  S1/S2 no murmur, no rub, gallop or click PMI normal Abdomen: benighn, BS positve, no tenderness, no AAA no bruit.  No HSM or HJR Distal pulses intact with no bruits No edema Neuro non-focal Skin warm and dry No muscular weakness   Labs:   Lab Results  Component Value Date   WBC 15.0 (H) 06/08/2021   HGB 13.0 06/08/2021   HCT 39.7 06/08/2021   MCV 85.6 06/08/2021   PLT 399 06/08/2021   No results for input(s): NA, K, CL, CO2, BUN, CREATININE, CALCIUM, PROT, BILITOT, ALKPHOS, ALT, AST, GLUCOSE in the last 168 hours.  Invalid input(s): LABALBU Lab Results  Component Value Date   TROPONINI <0.03 03/26/2017    Lab Results  Component Value Date   CHOL 229 (H) 07/31/2020   CHOL 204 (H) 03/25/2017   CHOL 160 10/25/2016   Lab Results  Component Value Date   HDL 33 (L) 07/31/2020   HDL 30 (L) 03/25/2017   HDL 33 (L) 10/25/2016   Lab Results  Component Value Date   LDLCALC 164 (H) 07/31/2020   LDLCALC 118 (H) 03/25/2017   LDLCALC 90 10/25/2016   Lab Results  Component Value Date   TRIG 171 (H) 07/31/2020   TRIG  280 (H) 03/25/2017   TRIG 185 (H) 10/25/2016   Lab Results  Component Value Date   CHOLHDL 6.9 (H) 07/31/2020   CHOLHDL 6.8 (H) 03/25/2017   CHOLHDL 4.8 (H) 10/25/2016   No results found for: LDLDIRECT    Radiology: ECHOCARDIOGRAM COMPLETE  Result Date: 07/17/2021    ECHOCARDIOGRAM REPORT   Patient Name:   DELANO SCARDINO  Date of Exam: 07/17/2021 Medical Rec #:  [id number]          Height:       62.0 in Accession #:    [id number]         Weight:       140.0 lb Date of Birth:  11-Jan-1971          BSA:          1.643 m Patient Age:    59 years           BP:           140/100 mmHg Patient Gender: F                  HR:           80 bpm. Exam Location:  [facility] Procedure: 2D Echo, 3D Echo, Cardiac Doppler, Color Doppler and Strain Analysis Indications:    R00.0 Tachycardia  History:        Patient has no prior history of Echocardiogram examinations.                 Arrythmias:Tachycardia; Risk Factors:Hypertension, Dyslipidemia                 and Former Smoker. Anemia.  Sonographer:    Basilia Jumbo BS, RDCS Referring Phys: [place]  1. Left ventricular ejection fraction by 3D volume is 55 %. The left ventricle has normal function. The left ventricle has no regional wall motion abnormalities. Left ventricular diastolic parameters were normal. The average left ventricular global longitudinal strain is -23.5 %. The global longitudinal strain is normal.  2. Right ventricular systolic function is normal. The right ventricular size is normal.  3. The mitral valve is normal in structure. No evidence of mitral valve regurgitation. No evidence of mitral stenosis.  4. The aortic valve is normal in structure. Aortic valve regurgitation is not visualized. No aortic stenosis is present.  5. The inferior vena cava is normal in size with greater than 50% respiratory variability, suggesting right atrial pressure of 3 mmHg. FINDINGS  Left Ventricle: Left ventricular ejection fraction by 3D  volume is 55 %. The left ventricle has normal function. The left ventricle has no regional wall motion abnormalities. The average left ventricular global longitudinal strain is -23.5 %. The global  longitudinal strain is normal. The left ventricular internal cavity size was normal in size. There is no left ventricular hypertrophy. Left ventricular diastolic parameters were normal. Right Ventricle: The right ventricular size is normal. No increase in right ventricular wall thickness. Right ventricular systolic function is normal. Left Atrium: Left atrial size was normal in size. Right Atrium: Right atrial size was normal in size. Pericardium: There is no evidence of pericardial effusion. Mitral Valve: The mitral valve is normal in structure. No evidence of mitral valve regurgitation. No evidence of mitral valve stenosis. Tricuspid Valve: The tricuspid valve is normal in structure. Tricuspid valve regurgitation is not demonstrated. No evidence of tricuspid stenosis. Aortic Valve: The aortic valve is normal in structure. Aortic valve regurgitation is not visualized. No aortic stenosis is present. Pulmonic Valve: The pulmonic valve was normal in structure. Pulmonic valve regurgitation is not visualized. No evidence of pulmonic stenosis. Aorta: The aortic root is normal in size and structure. Venous: The inferior vena cava is normal in size with greater than 50% respiratory variability, suggesting right atrial pressure of 3 mmHg. IAS/Shunts: No atrial level shunt detected by color flow Doppler.  LEFT VENTRICLE PLAX 2D LVIDd:         4.10 cm         Diastology LVIDs:         2.50 cm         LV e' medial:    6.42 cm/s LV PW:         1.00 cm         LV E/e' medial:  11.5 LV IVS:        0.90 cm         LV e' lateral:   8.59 cm/s LVOT diam:     2.20 cm         LV E/e' lateral: 8.6 LV SV:         65 LV SV Index:   39              2D LVOT Area:     3.80 cm        Longitudinal                                Strain                                 2D Strain GLS  -25.0 %                                (A2C):                                2D Strain GLS  -22.1 %                                (A3C):                                2D Strain GLS  -23.4 %                                (A4C):                                2D Strain GLS  -23.5 %                                Avg:                                 3D Volume EF                                LV 3D EF:    Left                                             ventricul  ar                                             ejection                                             fraction                                             by 3D                                             volume is                                             55 %.                                 3D Volume EF:                                3D EF:        55 %                                LV EDV:       92 ml                                LV ESV:       41 ml                                LV SV:        51 ml RIGHT VENTRICLE             IVC RV Basal diam:  2.60 cm     IVC diam: 2.00 cm RV S prime:     12.60 cm/s TAPSE (M-mode): 1.9 cm LEFT ATRIUM             Index        RIGHT ATRIUM           Index LA diam:        3.70 cm 2.25 cm/m   RA Pressure: 3.00 mmHg LA Vol (A2C):   32.5 ml 19.78 ml/m  RA Area:     11.60 cm LA Vol (A4C):   21.4 ml 13.03 ml/m  RA Volume:   21.80 ml  13.27 ml/m LA Biplane Vol: 28.5 ml 17.35 ml/m  AORTIC VALVE LVOT Vmax:   98.90 cm/s LVOT Vmean:  62.400 cm/s LVOT VTI:    0.170 m  AORTA Ao Root diam: 2.70 cm Ao Asc  diam:  3.10 cm MITRAL VALVE               TRICUSPID VALVE                            Estimated RAP:  3.00 mmHg MV Decel Time: 176 msec MV E velocity: 73.90 cm/s  SHUNTS MV A velocity: 83.70 cm/s  Systemic VTI:  0.17 m MV E/A ratio:  0.88        Systemic Diam: 2.20 cm Kardie Tobb DO Electronically signed by Berniece Salines DO Signature Date/Time:  07/17/2021/6:00:14 PM    Final    LONG TERM MONITOR (3-14 DAYS)  Result Date: 07/12/2021 Patch Wear Time:  3 days and 0 hours (2023-01-24T14:33:15-0500 to 2023-01-27T15:29:59-0500) Patient had a min HR of 62 bpm, max HR of 138 bpm, and avg HR of 102 bpm. Predominant underlying rhythm was Sinus Rhythm. Isolated SVEs were rare (<1.0%), and no SVE Couplets or SVE Triplets were present. Isolated VEs were rare (<1.0%), and no VE Couplets or VE Triplets were present. Jenkins Rouge MD [facility]  VAS US RENAL ARTERY DUPLEX  Result Date: 07/17/2021 ABDOMINAL VISCERAL Patient Name:  SARAANN ENNEKING  Date of Exam:   07/17/2021 Medical Rec #: [id number]           Accession #:    [id number] Date of Birth: 12/11/1970           Patient Gender: F Patient Age:   44 years Exam Location:  [facility] Procedure:      VAS US RENAL ARTERY DUPLEX Referring Phys: [place] -------------------------------------------------------------------------------- Indications: Hypertension. Patient denies any abdominal pain. High Risk Factors: Hypertension, hyperlipidemia, past history of smoking. Comparison Study: NA Performing Technologist: Sharlett Iles RVT  Examination Guidelines: A complete evaluation includes B-mode imaging, spectral Doppler, color Doppler, and power Doppler as needed of all accessible portions of each vessel. Bilateral testing is considered an integral part of a complete examination. Limited examinations for reoccurring indications may be performed as noted.  Duplex Findings: +--------------------+--------+--------+------+----------------------+  Mesenteric           PSV cm/s EDV cm/s Plaque        Comments         +--------------------+--------+--------+------+----------------------+  Aorta Prox              56                    1.9 cm AP x 1.9 cm TRV  +--------------------+--------+--------+------+----------------------+  Aorta Distal            94                                             +--------------------+--------+--------+------+----------------------+  Celiac Artery Origin   159                                            +--------------------+--------+--------+------+----------------------+  SMA Origin             197       25                                   +--------------------+--------+--------+------+----------------------+    +------------------+--------+--------+---------------+  Right Renal Artery PSV cm/s EDV cm/s     Comment      +------------------+--------+--------+---------------+  Origin               121       37                     +------------------+--------+--------+---------------+  Proximal             121       46                     +------------------+--------+--------+---------------+  Mid                  172       69                     +------------------+--------+--------+---------------+  Distal                93       34    mild tortuosity  +------------------+--------+--------+---------------+ +-----------------+--------+--------+-------+  Left Renal Artery PSV cm/s EDV cm/s Comment  +-----------------+--------+--------+-------+  Origin              142       72             +-----------------+--------+--------+-------+  Proximal            213       83             +-----------------+--------+--------+-------+  Mid                 121       48             +-----------------+--------+--------+-------+  Distal              158       54             +-----------------+--------+--------+-------+ +------------+--------+--------+----+-----------+--------+--------+----+  Right Kidney PSV cm/s EDV cm/s RI   Left Kidney PSV cm/s EDV cm/s RI    +------------+--------+--------+----+-----------+--------+--------+----+  Upper Pole   23       10       0.56 Upper Pole  23       8        0.63  +------------+--------+--------+----+-----------+--------+--------+----+  Mid          28       12       0.56 Mid         20       9        0.57   +------------+--------+--------+----+-----------+--------+--------+----+  Lower Pole   15       6        0.58 Lower Pole  19       9        0.51  +------------+--------+--------+----+-----------+--------+--------+----+  Hilar        59       24       0.59 Hilar       63       21       0.67  +------------+--------+--------+----+-----------+--------+--------+----+ +------------------+------+------------------+------+  Right Kidney              Left Kidney                +------------------+------+------------------+------+  RAR  RAR                        +------------------+------+------------------+------+  RAR (manual)       3.07   RAR (manual)       3.80    +------------------+------+------------------+------+  Cortex             .90 cm Cortex             .80 cm  +------------------+------+------------------+------+  Cortex thickness          Corex thickness            +------------------+------+------------------+------+  Kidney length (cm) 10.58  Kidney length (cm) 11.10   +------------------+------+------------------+------+  Summary: Largest Aortic Diameter: 1.9 cm  Renal:  Right: Normal size right kidney. Normal right Resistive Index.        Normal cortical thickness of right kidney. 1-59% stenosis of        the right renal artery. RRV flow present. Left:  Normal size of left kidney. Normal left Resistive Index.        Normal cortical thickness of the left kidney. 1-59% stenosis        of the left renal artery. LRV flow present. Mesenteric: Normal Superior Mesenteric artery and Celiac artery findings.  Patent IVC.  *See table(s) above for measurements and observations.  Diagnosing physician: Jenkins Rouge MD  Electronically signed by Jenkins Rouge MD on 07/17/2021 at 5:55:40 PM.    Final     EKG: See HPI   ASSESSMENT AND PLAN:   HTN:  Continue ARB/beta blocker Doses increased 07/02/21 metanephrines negative No RAS on duplex *** Tachycardia:  sinus in setting of recent spine surgery and  steroid Metanephrines negative monitor with no significant arrhythmias *** DM:  Discussed low carb diet.  Target hemoglobin A1c is 6.5 or less.  Continue current medications. C-spine:  post C5-7 fusion f/u Jenkins    ***  F/U in 6 months   Signed: Jenkins Rouge 07/19/2021, 1:06 PM

## 2021-07-26 ENCOUNTER — Ambulatory Visit: Payer: BC Managed Care – PPO | Admitting: Cardiovascular Disease

## 2021-08-06 ENCOUNTER — Encounter: Payer: Self-pay | Admitting: Family Medicine

## 2021-08-07 NOTE — Telephone Encounter (Signed)
Nurses So typically I am comfortable with testing vitamin D, vitamin B12, potassium, magnesium  As for the other entities I do not test these.  If she is interested in doing testing of not unusual nutrients and vitamin levels then I would recommend she consider going to a integrative medicine clinic such as [facility] This group typically will do wellness extensive laboratory testing.  It should be noted though that much of that is often not covered by insurance and considered to be out-of-pocket.  If Yazhini would like to have a discussion regarding general health or any other specific health issues we will be happy to see her, discuss health issues accordingly in order to test appropriately  Thanks-Dr. Nicki Reaper

## 2021-08-14 DIAGNOSIS — M4712 Other spondylosis with myelopathy, cervical region: Secondary | ICD-10-CM | POA: Diagnosis not present

## 2021-09-06 ENCOUNTER — Encounter: Payer: Self-pay | Admitting: Cardiovascular Disease

## 2021-09-17 ENCOUNTER — Ambulatory Visit: Payer: BC Managed Care – PPO | Admitting: Cardiovascular Disease

## 2021-10-05 DIAGNOSIS — E782 Mixed hyperlipidemia: Secondary | ICD-10-CM | POA: Diagnosis not present

## 2021-10-05 DIAGNOSIS — I10 Essential (primary) hypertension: Secondary | ICD-10-CM | POA: Diagnosis not present

## 2021-10-05 DIAGNOSIS — R7301 Impaired fasting glucose: Secondary | ICD-10-CM | POA: Diagnosis not present

## 2021-10-05 DIAGNOSIS — E559 Vitamin D deficiency, unspecified: Secondary | ICD-10-CM | POA: Diagnosis not present

## 2021-10-05 DIAGNOSIS — D508 Other iron deficiency anemias: Secondary | ICD-10-CM | POA: Diagnosis not present

## 2021-11-08 NOTE — Progress Notes (Incomplete)
CARDIOLOGY CONSULT NOTE       Patient ID: Samantha Moreno MRN: [id number] DOB/AGE: [birth year] 51 y.o.  Admit date: (Not on file) Referring Physician: Wolfgang Phoenix Primary Physician: Kathyrn Drown, MD Primary Cardiologist: Johnsie Cancel Reason for Consultation: HTN/Tachycardia/Family history SCD    HPI:  51 y.o. referred by DR Wolfgang Phoenix for HTN with persistent tachycardia and family history of SCD.  First seen by cardiology 07/03/21 She has a history of gestational DM She has been Rx with metoprolol and losartan added on 06/16/2021 She had spine surgery 05/30/21   Cervical fusion C5-7 Elevated HR and BP since postoperative course. She had a course of steroids post op.  Her A1c is 6.2 Hct 39.7 TSH 3.4   ECG in ER showed ST rate 146 06/14/21  Did not appear to be flutter   She states that during the surgery she has some kind of event where her blood pressure got very high and her heart rate got low but she is not clear on what happened.  She states since that time she has felt unwell.  She just felt kind of weak had some nausea and just decreased energy.   She has an older daughter with 3 grand kids in [place] and a home schooled 51 yo Has some coffee/coke but not excessive. No chest pain dyspnea syncope No smoking Or illicit drugs.   Monitor 07/12/21 normal HR drops to 62 with average 102 PAC;s < 1% TTE normal EF 55%  No RAS on duplex Urine metanephrines normal   Started on Toprol 100 mg 07/03/21   ***   ROS All other systems reviewed and negative except as noted above  Past Medical History:  Diagnosis Date  . Gestational diabetes   . Headache   . HTN (hypertension)   . Nocturnal leg cramps 03/22/2019    Family History  Problem Relation Age of Onset  . Stroke Mother   . Heart disease Mother   . Stroke Father   . Diabetes Father   . Hypertension Father   . Prostate cancer Father   . Heart attack Sister   . Colon cancer Neg Hx   . Colon polyps Neg Hx     Social History    Socioeconomic History  . Marital status: Single    Spouse name: Not on file  . Number of children: Not on file  . Years of education: Not on file  . Highest education level: Not on file  Occupational History  . Occupation: Engineer, maintenance ([employer])  Tobacco Use  . Smoking status: Former    Types: Cigarettes    Quit date: 03/20/1991    Years since quitting: 30.6  . Smokeless tobacco: Never  . Tobacco comments:    smoke for 2 years around age 39  Vaping Use  . Vaping Use: Never used  Substance and Sexual Activity  . Alcohol use: Yes    Comment: occasionally  . Drug use: No  . Sexual activity: Yes    Partners: Male    Birth control/protection: None  Other Topics Concern  . Not on file  Social History Narrative  . Not on file   Social Determinants of Health   Financial Resource Strain: Not on file  Food Insecurity: Not on file  Transportation Needs: Not on file  Physical Activity: Not on file  Stress: Not on file  Social Connections: Not on file  Intimate Partner Violence: Not on file    Past Surgical History:  Procedure Laterality Date  .  COLONOSCOPY N/A 11/22/2016   Dr. Gala Romney: Distal 5 cm of terminal ileum normal. Diverticulosis in the sigmoid colon and descending colon.  . ESOPHAGOGASTRODUODENOSCOPY N/A 11/22/2016   Dr. Gala Romney: H. pylori gastritis.  Marland Kitchen exploratory laparoscopy  1992/1993   abdominal pain after second child   . GIVENS CAPSULE STUDY N/A 11/26/2016   Procedure: GIVENS CAPSULE STUDY;  Surgeon: Daneil Dolin, MD;  Location: [facility];  Service: Endoscopy;  Laterality: N/A;  700      Current Outpatient Medications:  .  acetaminophen (TYLENOL) 325 MG tablet, Take 650 mg by mouth every 6 (six) hours as needed for mild pain or moderate pain., Disp: , Rfl:  .  HYDROcodone-acetaminophen (NORCO/VICODIN) 5-325 MG tablet, Take 1-2 tablets by mouth daily., Disp: , Rfl:  .  ibuprofen (ADVIL,MOTRIN) 200 MG tablet, Take 200 mg by mouth every 6 (six) hours as  needed., Disp: , Rfl:  .  losartan (COZAAR) 100 MG tablet, Take 1 tablet (100 mg total) by mouth daily., Disp: 90 tablet, Rfl: 3 .  metoprolol succinate (TOPROL-XL) 100 MG 24 hr tablet, Take 1 tablet (100 mg total) by mouth daily., Disp: 90 tablet, Rfl: 3 .  naloxone (NARCAN) nasal spray 4 mg/0.1 mL, as directed., Disp: , Rfl:  .  promethazine (PHENERGAN) 25 MG tablet, TAKE 1 TABLET BY MOUTH EVERY 6 HOURS AS NEEDED FOR NAUSEA OR VOMITING, Disp: 30 tablet, Rfl: 1 .  Vitamin D, Ergocalciferol, (DRISDOL) 1.25 MG (50000 UNIT) CAPS capsule, Take one tablet po each week for next 8 weeks, Disp: 4 capsule, Rfl: 1    Physical Exam: Last menstrual period 09/07/2017.    Affect appropriate Healthy:  appears stated age 51: normal post anterior cervical fusion  Neck supple with no adenopathy JVP normal no bruits no thyromegaly Lungs clear with no wheezing and good diaphragmatic motion Heart:  S1/S2 no murmur, no rub, gallop or click PMI normal Abdomen: benighn, BS positve, no tenderness, no AAA no bruit.  No HSM or HJR Distal pulses intact with no bruits No edema Neuro non-focal Skin warm and dry No muscular weakness   Labs:   Lab Results  Component Value Date   WBC 15.0 (H) 06/08/2021   HGB 13.0 06/08/2021   HCT 39.7 06/08/2021   MCV 85.6 06/08/2021   PLT 399 06/08/2021   No results for input(s): NA, K, CL, CO2, BUN, CREATININE, CALCIUM, PROT, BILITOT, ALKPHOS, ALT, AST, GLUCOSE in the last 168 hours.  Invalid input(s): LABALBU Lab Results  Component Value Date   TROPONINI <0.03 03/26/2017    Lab Results  Component Value Date   CHOL 229 (H) 07/31/2020   CHOL 204 (H) 03/25/2017   CHOL 160 10/25/2016   Lab Results  Component Value Date   HDL 33 (L) 07/31/2020   HDL 30 (L) 03/25/2017   HDL 33 (L) 10/25/2016   Lab Results  Component Value Date   LDLCALC 164 (H) 07/31/2020   LDLCALC 118 (H) 03/25/2017   LDLCALC 90 10/25/2016   Lab Results  Component Value Date   TRIG  171 (H) 07/31/2020   TRIG 280 (H) 03/25/2017   TRIG 185 (H) 10/25/2016   Lab Results  Component Value Date   CHOLHDL 6.9 (H) 07/31/2020   CHOLHDL 6.8 (H) 03/25/2017   CHOLHDL 4.8 (H) 10/25/2016   No results found for: LDLDIRECT    Radiology: No results found.  EKG: See HPI   ASSESSMENT AND PLAN:   HTN:  Continue ARB/beta blocker no RAS on duplex  diet discussed Tachycardia:  sinus in setting of recent spine surgery and steroid use echo normal monitor normal including nocturnal slowing continue Toprol  DM:  Discussed low carb diet.  Target hemoglobin A1c is 6.5 or less.  Continue current medications. C-spine:  post C5-7 fusion f/u Arnoldo Morale    F/U with cardiology PRN   Signed: Jenkins Rouge 11/08/2021, 8:27 AM

## 2021-11-14 ENCOUNTER — Ambulatory Visit: Payer: BC Managed Care – PPO | Admitting: Cardiovascular Disease

## 2021-12-24 NOTE — Progress Notes (Deleted)
CARDIOLOGY CONSULT NOTE       Patient ID: Samantha Moreno MRN: [id number] DOB/AGE: [birth year] 51 y.o.  Admit date: (Not on file) Referring Physician: Wolfgang Phoenix Primary Physician: Kathyrn Drown, MD Primary Cardiologist: Johnsie Cancel Reason for Consultation: HTN/Tachycardia/Family history SCD  Active Problems:   * No active hospital problems. *   HPI:  51 y.o. referred by DR Wolfgang Phoenix 07/03/21  for HTN with persistent tachycardia and family history of SCD.  She has a history of gestational DM She has been Rx with metoprolol and losartan added on 06/16/2021 She had spine surgery 05/30/21   Cervical fusion C5-7 Elevated HR and BP since postoperative course. She had a course of steroids post op.  Her A1c is 6.2 Hct 39.7 TSH 3.4   ECG in ER showed ST rate 146 06/14/21  Did not appear to be flutter   She states that during the surgery she has some kind of event where her blood pressure got very high and her heart rate got low but she is not clear on what happened.  She states since that time she has felt unwell.  She just felt kind of weak had some nausea and just decreased energy.   She has an older daughter with 3 grand kids in [place] and a home schooled 51 yo Has some coffee/coke but not excessive. No chest pain dyspnea syncope No smoking Or illicit drugs.   Monitor 07/12/21 showed no significant arrhythmias HR range 62-138 with average 102 bpm.  PAC/PVC;s < 1% total beats  TTE 07/17/21 Normal EF 55% no valve dx Renal duplex 07/17/21 no RAS   ***  ROS All other systems reviewed and negative except as noted above  Past Medical History:  Diagnosis Date   Gestational diabetes    Headache    HTN (hypertension)    Nocturnal leg cramps 03/22/2019    Family History  Problem Relation Age of Onset   Stroke Mother    Heart disease Mother    Stroke Father    Diabetes Father    Hypertension Father    Prostate cancer Father    Heart attack Sister    Colon cancer Neg Hx    Colon polyps Neg Hx      Social History   Socioeconomic History   Marital status: Single    Spouse name: Not on file   Number of children: Not on file   Years of education: Not on file   Highest education level: Not on file  Occupational History   Occupation: Engineer, maintenance (IT)  Tobacco Use   Smoking status: Former    Types: Cigarettes    Quit date: 03/20/1991    Years since quitting: 30.7   Smokeless tobacco: Never   Tobacco comments:    smoke for 2 years around age 7  Vaping Use   Vaping Use: Never used  Substance and Sexual Activity   Alcohol use: Yes    Comment: occasionally   Drug use: No   Sexual activity: Yes    Partners: Male    Birth control/protection: None  Other Topics Concern   Not on file  Social History Narrative   Not on file   Social Determinants of Health   Financial Resource Strain: Not on file  Food Insecurity: Not on file  Transportation Needs: Not on file  Physical Activity: Not on file  Stress: Not on file  Social Connections: Not on file  Intimate Partner Violence: Not on file    Past Surgical  History:  Procedure Laterality Date   COLONOSCOPY N/A 11/22/2016   Dr. Gala Romney: Distal 5 cm of terminal ileum normal. Diverticulosis in the sigmoid colon and descending colon.   ESOPHAGOGASTRODUODENOSCOPY N/A 11/22/2016   Dr. Gala Romney: H. pylori gastritis.   exploratory laparoscopy  1992/1993   abdominal pain after second child    GIVENS CAPSULE STUDY N/A 11/26/2016   Procedure: GIVENS CAPSULE STUDY;  Surgeon: Daneil Dolin, MD;  Location: [facility];  Service: Endoscopy;  Laterality: N/A;  700      Current Outpatient Medications:    acetaminophen (TYLENOL) 325 MG tablet, Take 650 mg by mouth every 6 (six) hours as needed for mild pain or moderate pain., Disp: , Rfl:    HYDROcodone-acetaminophen (NORCO/VICODIN) 5-325 MG tablet, Take 1-2 tablets by mouth daily., Disp: , Rfl:    ibuprofen (ADVIL,MOTRIN) 200 MG tablet, Take 200 mg by mouth every 6 (six) hours as  needed., Disp: , Rfl:    losartan (COZAAR) 100 MG tablet, Take 1 tablet (100 mg total) by mouth daily., Disp: 90 tablet, Rfl: 3   metoprolol succinate (TOPROL-XL) 100 MG 24 hr tablet, Take 1 tablet (100 mg total) by mouth daily., Disp: 90 tablet, Rfl: 3   naloxone (NARCAN) nasal spray 4 mg/0.1 mL, as directed., Disp: , Rfl:    promethazine (PHENERGAN) 25 MG tablet, TAKE 1 TABLET BY MOUTH EVERY 6 HOURS AS NEEDED FOR NAUSEA OR VOMITING, Disp: 30 tablet, Rfl: 1   Vitamin D, Ergocalciferol, (DRISDOL) 1.25 MG (50000 UNIT) CAPS capsule, Take one tablet po each week for next 8 weeks, Disp: 4 capsule, Rfl: 1    Physical Exam: Last menstrual period 09/07/2017.    Affect appropriate Healthy:  appears stated age 51: normal post anterior cervical fusion  Neck supple with no adenopathy JVP normal no bruits no thyromegaly Lungs clear with no wheezing and good diaphragmatic motion Heart:  S1/S2 no murmur, no rub, gallop or click PMI normal Abdomen: benighn, BS positve, no tenderness, no AAA no bruit.  No HSM or HJR Distal pulses intact with no bruits No edema Neuro non-focal Skin warm and dry No muscular weakness   Labs:   Lab Results  Component Value Date   WBC 15.0 (H) 06/08/2021   HGB 13.0 06/08/2021   HCT 39.7 06/08/2021   MCV 85.6 06/08/2021   PLT 399 06/08/2021   No results for input(s): "NA", "K", "CL", "CO2", "BUN", "CREATININE", "CALCIUM", "PROT", "BILITOT", "ALKPHOS", "ALT", "AST", "GLUCOSE" in the last 168 hours.  Invalid input(s): "LABALBU" Lab Results  Component Value Date   TROPONINI <0.03 03/26/2017    Lab Results  Component Value Date   CHOL 229 (H) 07/31/2020   CHOL 204 (H) 03/25/2017   CHOL 160 10/25/2016   Lab Results  Component Value Date   HDL 33 (L) 07/31/2020   HDL 30 (L) 03/25/2017   HDL 33 (L) 10/25/2016   Lab Results  Component Value Date   LDLCALC 164 (H) 07/31/2020   LDLCALC 118 (H) 03/25/2017   LDLCALC 90 10/25/2016   Lab Results   Component Value Date   TRIG 171 (H) 07/31/2020   TRIG 280 (H) 03/25/2017   TRIG 185 (H) 10/25/2016   Lab Results  Component Value Date   CHOLHDL 6.9 (H) 07/31/2020   CHOLHDL 6.8 (H) 03/25/2017   CHOLHDL 4.8 (H) 10/25/2016   No results found for: "LDLDIRECT"    Radiology: No results found.  EKG: See HPI   ASSESSMENT AND PLAN:   HTN:  Continue ARB/beta blocker no RAS on duplex and normal metanephrines in urine  Tachycardia:  sinus initially noted after spinal surgery and steroid use. Monitor benign HR as low as 62 bpm normal nocturnal drop  DM:  Discussed low carb diet.  Target hemoglobin A1c is 6.5 or less.  Continue current medications. C-spine:  post C5-7 fusion f/u Arnoldo Morale   ***  F/U cardiology PRN   Signed: Jenkins Rouge 12/24/2021, 7:57 PM

## 2021-12-31 ENCOUNTER — Encounter: Payer: Self-pay | Admitting: Cardiovascular Disease

## 2022-01-02 ENCOUNTER — Ambulatory Visit: Payer: BC Managed Care – PPO | Admitting: Cardiovascular Disease

## 2022-01-04 DIAGNOSIS — E782 Mixed hyperlipidemia: Secondary | ICD-10-CM | POA: Diagnosis not present

## 2022-01-04 DIAGNOSIS — Z0000 Encounter for general adult medical examination without abnormal findings: Secondary | ICD-10-CM | POA: Diagnosis not present

## 2022-01-04 DIAGNOSIS — E559 Vitamin D deficiency, unspecified: Secondary | ICD-10-CM | POA: Diagnosis not present

## 2022-01-04 DIAGNOSIS — N924 Excessive bleeding in the premenopausal period: Secondary | ICD-10-CM | POA: Diagnosis not present

## 2022-01-04 DIAGNOSIS — Z01419 Encounter for gynecological examination (general) (routine) without abnormal findings: Secondary | ICD-10-CM | POA: Diagnosis not present

## 2022-01-04 DIAGNOSIS — R7301 Impaired fasting glucose: Secondary | ICD-10-CM | POA: Diagnosis not present

## 2022-05-27 ENCOUNTER — Other Ambulatory Visit: Payer: Self-pay | Admitting: *Deleted

## 2022-05-27 DIAGNOSIS — E041 Nontoxic single thyroid nodule: Secondary | ICD-10-CM

## 2022-06-04 ENCOUNTER — Ambulatory Visit (HOSPITAL_COMMUNITY): Payer: BC Managed Care – PPO

## 2022-06-17 ENCOUNTER — Ambulatory Visit (HOSPITAL_COMMUNITY): Payer: BC Managed Care – PPO | Attending: Family Medicine

## 2022-06-24 ENCOUNTER — Telehealth: Payer: Self-pay | Admitting: *Deleted

## 2022-06-24 ENCOUNTER — Encounter: Payer: Self-pay | Admitting: Family Medicine

## 2022-06-24 NOTE — Telephone Encounter (Signed)
We will send a certified letter

## 2022-06-24 NOTE — Progress Notes (Signed)
Please mail certified mail to the patient

## 2022-06-24 NOTE — Telephone Encounter (Signed)
Patient in reminder file for repeat thyroid U/S in December  We have scheduled twice- the first appt was a no show and the second time was a cancel with no reschedule

## 2022-06-25 ENCOUNTER — Other Ambulatory Visit (HOSPITAL_COMMUNITY): Payer: Self-pay | Admitting: Adult Health Nurse Practitioner

## 2022-06-25 DIAGNOSIS — E041 Nontoxic single thyroid nodule: Secondary | ICD-10-CM

## 2022-07-15 ENCOUNTER — Ambulatory Visit (HOSPITAL_COMMUNITY): Payer: BC Managed Care – PPO

## 2022-08-09 ENCOUNTER — Other Ambulatory Visit: Payer: Self-pay | Admitting: Family Medicine

## 2022-08-09 DIAGNOSIS — Z1231 Encounter for screening mammogram for malignant neoplasm of breast: Secondary | ICD-10-CM

## 2022-08-12 ENCOUNTER — Ambulatory Visit (HOSPITAL_COMMUNITY): Payer: BC Managed Care – PPO

## 2022-09-12 ENCOUNTER — Ambulatory Visit (HOSPITAL_COMMUNITY)
Admission: RE | Admit: 2022-09-12 | Discharge: 2022-09-12 | Disposition: A | Discharge status: Home / Self Care | Payer: BC Managed Care – PPO | Source: Ambulatory Visit | Attending: Adult Health Nurse Practitioner | Admitting: Adult Health Nurse Practitioner

## 2022-09-12 DIAGNOSIS — E041 Nontoxic single thyroid nodule: Secondary | ICD-10-CM | POA: Insufficient documentation

## 2022-09-16 ENCOUNTER — Telehealth: Payer: Self-pay | Admitting: *Deleted

## 2022-09-17 NOTE — Telephone Encounter (Signed)
Nurses-in regards to the thyroid results-under epic it appears that this was ordered by nurse practitioner with [facility]-please confirm with patient that she is following through with a different provider-if that be the case we will sign off on her care in future visits follow-up medications etc. should be with her new provider  If that is not the case then we can continue providing care otherwise we will sign off thank you

## 2022-09-19 NOTE — Telephone Encounter (Signed)
Left message to return call 

## 2022-10-03 ENCOUNTER — Encounter: Payer: Self-pay | Admitting: Family Medicine

## 2022-10-03 NOTE — Telephone Encounter (Signed)
A MyChart message is being sent to the patient hopefully she will respond-if she does not respond by 9 May we will send a certified letter

## 2022-10-03 NOTE — Telephone Encounter (Signed)
She did respond by MyChart message and message was sent to the nurses to put it into the reminder file

## 2022-10-03 NOTE — Telephone Encounter (Signed)
Nurses Please put into the reminder file for Samantha Moreno to have thyroid ultrasound in April 2025 due to thyroid nodule  She can schedule a regular follow-up visit with her somewhere in the summer  Thanks-Dr. Lilyan Punt

## 2022-10-04 NOTE — Telephone Encounter (Signed)
Follow up U/S added to reminder file

## 2022-11-27 ENCOUNTER — Other Ambulatory Visit (HOSPITAL_COMMUNITY): Payer: Self-pay | Admitting: Adult Health Nurse Practitioner

## 2022-11-27 DIAGNOSIS — Z1231 Encounter for screening mammogram for malignant neoplasm of breast: Secondary | ICD-10-CM

## 2023-01-08 ENCOUNTER — Ambulatory Visit (HOSPITAL_COMMUNITY): Payer: BC Managed Care – PPO

## 2023-01-25 ENCOUNTER — Encounter (HOSPITAL_COMMUNITY): Payer: Self-pay | Admitting: Emergency Medicine

## 2023-01-25 ENCOUNTER — Other Ambulatory Visit: Payer: Self-pay

## 2023-01-25 ENCOUNTER — Emergency Department (HOSPITAL_COMMUNITY)
Admission: EM | Admit: 2023-01-25 | Discharge: 2023-01-26 | Disposition: A | Discharge status: Home / Self Care | Payer: BC Managed Care – PPO | Attending: Emergency Medicine | Admitting: Emergency Medicine

## 2023-01-25 ENCOUNTER — Emergency Department (HOSPITAL_COMMUNITY): Payer: BC Managed Care – PPO

## 2023-01-25 DIAGNOSIS — S0081XA Abrasion of other part of head, initial encounter: Secondary | ICD-10-CM | POA: Insufficient documentation

## 2023-01-25 DIAGNOSIS — I10 Essential (primary) hypertension: Secondary | ICD-10-CM | POA: Diagnosis not present

## 2023-01-25 DIAGNOSIS — S0993XA Unspecified injury of face, initial encounter: Secondary | ICD-10-CM | POA: Diagnosis present

## 2023-01-25 DIAGNOSIS — Y92014 Private driveway to single-family (private) house as the place of occurrence of the external cause: Secondary | ICD-10-CM | POA: Insufficient documentation

## 2023-01-25 DIAGNOSIS — W01198A Fall on same level from slipping, tripping and stumbling with subsequent striking against other object, initial encounter: Secondary | ICD-10-CM | POA: Insufficient documentation

## 2023-01-25 DIAGNOSIS — Z87891 Personal history of nicotine dependence: Secondary | ICD-10-CM | POA: Insufficient documentation

## 2023-01-25 DIAGNOSIS — W19XXXA Unspecified fall, initial encounter: Secondary | ICD-10-CM

## 2023-01-25 DIAGNOSIS — T148XXA Other injury of unspecified body region, initial encounter: Secondary | ICD-10-CM

## 2023-01-25 MED ORDER — MORPHINE SULFATE (PF) 2 MG/ML IV SOLN
2.0000 mg | Freq: Once | INTRAVENOUS | Status: AC
  Administered 2023-01-25: 2 mg via INTRAVENOUS
  Filled 2023-01-25: qty 1

## 2023-01-25 MED ORDER — ONDANSETRON HCL 4 MG/2ML IJ SOLN
4.0000 mg | Freq: Once | INTRAMUSCULAR | Status: AC
  Administered 2023-01-25: 4 mg via INTRAVENOUS
  Filled 2023-01-25: qty 2

## 2023-01-25 NOTE — ED Triage Notes (Signed)
Pt to ED from home c/o fall.  Was working on car when she tripped and hit face on ground in [employer].  Unsure if LOC but was lying on ground and was helped up by brother.  Laceration to inside lower lip, outside upper lip, abrasion right hand, abrasion left forehead, chipped front left tooth.  Denies blood thinners.  C/o nausea and dry heaves.  Pt A&Ox4.

## 2023-01-25 NOTE — ED Provider Triage Note (Signed)
Emergency Medicine Provider Triage Evaluation Note  Samantha Moreno , a 52 y.o. female  was evaluated in triage.  Pt complains of fall.  Patient reports that she was involved in a mechanical fall earlier today.  Denies any symptoms prior to fall and reports that she tripped and stumbled and landed face first on the ground.  Patient does have a chipped tooth and lacerations noted to the anterior inferior lip as well as the exterior superior lip.  Not currently on blood thinners.  Initially presented with some nausea but no episodes of vomiting.  Reports that she is not currently having any headache and only mild nausea at this point.  Review of Systems  Positive: As above Negative: As above  Physical Exam  BP (!) 188/114 (BP Location: Left Arm)   Pulse (!) 106   Temp 99 F (37.2 C) (Oral)   Resp 18   Ht 5\' 2"  (1.575 m)   Wt 59.9 kg   LMP 09/07/2017   SpO2 99%   BMI 24.14 kg/m  Gen:   Awake, no distress   Resp:  Normal effort  MSK:   Superficial abrasions noted to the bilateral hands.  No obvious bony deformity. Other:  Lacerations noted to the superior exterior lip as well as the inferior anterior lip.  Chipped tooth on the upper teeth.  Eyes are PERRL.   Medical Decision Making  Medically screening exam initiated at 10:47 PM.  Appropriate orders placed.  [name] was informed that the remainder of the evaluation will be completed by another provider, this initial triage assessment does not replace that evaluation, and the importance of remaining in the ED until their evaluation is complete.     Smitty Knudsen, PA-C 01/25/23 2251

## 2023-01-26 DIAGNOSIS — S0081XA Abrasion of other part of head, initial encounter: Secondary | ICD-10-CM | POA: Diagnosis not present

## 2023-01-26 MED ORDER — OXYCODONE HCL 5 MG PO TABS
5.0000 mg | ORAL_TABLET | Freq: Once | ORAL | Status: AC
  Administered 2023-01-26: 5 mg via ORAL
  Filled 2023-01-26: qty 1

## 2023-01-26 MED ORDER — TETANUS-DIPHTH-ACELL PERTUSSIS 5-2.5-18.5 LF-MCG/0.5 IM SUSY: 0.5000 mL | PREFILLED_SYRINGE | Freq: Once | INTRAMUSCULAR | Status: DC

## 2023-01-26 NOTE — Discharge Instructions (Signed)
You were evaluated in the Emergency Department and after careful evaluation, we did not find any emergent condition requiring admission or further testing in the hospital.  Your exam/testing today was overall reassuring.  Tylenol or Motrin at home for discomfort.  Please return to the Emergency Department if you experience any worsening of your condition.  Thank you for allowing [place] to be a part of your care.

## 2023-01-26 NOTE — ED Provider Notes (Signed)
[facility]-EMERGENCY DEPT [facility] Emergency Department Provider Note MRN:  [id number]  Arrival date & time: 01/26/23     Chief Complaint   Fall   History of Present Illness   Samantha Moreno is a 52 y.o. year-old female with a history of hypertension presenting to the ED with chief complaint of fall.  Tripped over something in the garage and fell forward onto her face.  No loss of consciousness.  Denies any other significant injuries.  Review of Systems  A thorough review of systems was obtained and all systems are negative except as noted in the HPI and PMH.   Patient's Health History    Past Medical History:  Diagnosis Date   Gestational diabetes    Headache    HTN (hypertension)    Nocturnal leg cramps 03/22/2019    Past Surgical History:  Procedure Laterality Date   COLONOSCOPY N/A 11/22/2016   Dr. Jena Gauss: Distal 5 cm of terminal ileum normal. Diverticulosis in the sigmoid colon and descending colon.   ESOPHAGOGASTRODUODENOSCOPY N/A 11/22/2016   Dr. Jena Gauss: H. pylori gastritis.   exploratory laparoscopy  1992/1993   abdominal pain after second child    GIVENS CAPSULE STUDY N/A 11/26/2016   Procedure: GIVENS CAPSULE STUDY;  Surgeon: Corbin Ade, MD;  Location: [facility] ENDO SUITE;  Service: Endoscopy;  Laterality: N/A;  700    Family History  Problem Relation Age of Onset   Stroke Mother    Heart disease Mother    Stroke Father    Diabetes Father    Hypertension Father    Prostate cancer Father    Heart attack Sister    Colon cancer Neg Hx    Colon polyps Neg Hx     Social History   Socioeconomic History   Marital status: Single    Spouse name: Not on file   Number of children: Not on file   Years of education: Not on file   Highest education level: Not on file  Occupational History   Occupation: Lobbyist  Tobacco Use   Smoking status: Former    Current packs/day: 0.00    Types: Cigarettes    Quit date: 03/20/1991    Years since  quitting: 31.8   Smokeless tobacco: Never   Tobacco comments:    smoke for 2 years around age 39  Vaping Use   Vaping status: Never Used  Substance and Sexual Activity   Alcohol use: Yes    Comment: occasionally   Drug use: No   Sexual activity: Yes    Partners: Male    Birth control/protection: None  Other Topics Concern   Not on file  Social History Narrative   Not on file   Social Determinants of Health   Financial Resource Strain: Patient Declined (09/14/2021)   Received from [facility], [facility]   Overall Financial Resource Strain (CARDIA)    Difficulty of Paying Living Expenses: Patient declined  Food Insecurity: Patient Declined (09/14/2021)   Received from [facility], [facility]   Hunger Vital Sign    Worried About Running Out of Food in the Last Year: Patient declined    Ran Out of Food in the Last Year: Patient declined  Transportation Needs: Not on file  Physical Activity: Sufficiently Active (09/14/2021)   Received from [facility], [facility]   Exercise Vital Sign    Days of Exercise per Week: 5 days    Minutes of Exercise per Session: 40 min  Stress: Stress Concern  Present (09/14/2021)   Received from [facility], [facility] - Occupational Stress Questionnaire    Feeling of Stress : To some extent  Social Connections: Unknown (01/05/2023)   Received from [facility]   Social Network    Social Network: Not on file  Intimate Partner Violence: Unknown (01/05/2023)   Received from [facility]   HITS    Physically Hurt: Not on file    Insult or Talk Down To: Not on file    Threaten Physical Harm: Not on file    Scream or Curse: Not on file     Physical Exam   Vitals:   01/25/23 2150 01/26/23 0215  BP: (!) 188/114 (!) 170/88  Pulse: (!) 106 100  Resp: 18 18  Temp: 99 F (37.2 C) 98.9 F (37.2 C)  SpO2: 99% 98%    CONSTITUTIONAL: Well-appearing, NAD NEURO/PSYCH:  Alert and oriented x  3, no focal deficits EYES:  eyes equal and reactive ENT/NECK:  no LAD, no JVD CARDIO: Regular rate, well-perfused, normal S1 and S2 PULM:  CTAB no wheezing or rhonchi GI/GU:  non-distended, non-tender MSK/SPINE:  No gross deformities, no edema SKIN:  no rash, atraumatic   *Additional and/or pertinent findings included in MDM below  Diagnostic and Interventional Summary    EKG Interpretation Date/Time:    Ventricular Rate:    PR Interval:    QRS Duration:    QT Interval:    QTC Calculation:   R Axis:      Text Interpretation:         Labs Reviewed - No data to display  DG Wrist Complete Right  Final Result    DG Hand Complete Right  Final Result    DG Wrist Complete Left  Final Result    DG Hand Complete Left  Final Result    CT Head Wo Contrast  Final Result    CT Maxillofacial Wo Contrast  Final Result    CT Cervical Spine Wo Contrast  Final Result      Medications  Tdap (BOOSTRIX) injection 0.5 mL (0.5 mLs Intramuscular Not Given 01/26/23 0210)  ondansetron (ZOFRAN) injection 4 mg (4 mg Intravenous Given 01/25/23 2154)  morphine (PF) 2 MG/ML injection 2 mg (2 mg Intravenous Given 01/25/23 2252)  oxyCODONE (Oxy IR/ROXICODONE) immediate release tablet 5 mg (5 mg Oral Given 01/26/23 0208)     Procedures  /  Critical Care Procedures  ED Course and Medical Decision Making  Initial Impression and Ddx Patient is well-appearing with reassuring vital signs, no neurological deficits.  Normal range of motion of the arms and legs, no abdominal tenderness, clear lungs, no chest pain or shortness of breath.  Differential diagnosis includes facial fractures, cervical spinal fracture, subdural hematoma.  Past medical/surgical history that increases complexity of ED encounter: None  Interpretation of Diagnostics Imaging is without significant traumatic injury, patient is aware of her thyroid nodule  Patient Reassessment and Ultimate Disposition/Management      Discharge.  Patient management required discussion with the following services or consulting groups:  None  Complexity of Problems Addressed Acute illness or injury that poses threat of life of bodily function  Additional Data Reviewed and Analyzed Further history obtained from: Further history from spouse/family member  Additional Factors Impacting ED Encounter Risk None  Elmer Sow. Pilar Plate, MD [facility] Emergency Medicine [facility] [email or web address]  Final Clinical Impressions(s) / ED Diagnoses     ICD-10-CM   1.  Fall, initial encounter  W19.XXXA     2. Abrasion  [email or web address]       ED Discharge Orders     None        Discharge Instructions Discussed with and Provided to Patient:    Discharge Instructions      You were evaluated in the Emergency Department and after careful evaluation, we did not find any emergent condition requiring admission or further testing in the hospital.  Your exam/testing today was overall reassuring.  Tylenol or Motrin at home for discomfort.  Please return to the Emergency Department if you experience any worsening of your condition.  Thank you for allowing [place] to be a part of your care.       Sabas Sous, MD 01/26/23 [phone]

## 2023-03-10 ENCOUNTER — Inpatient Hospital Stay (HOSPITAL_COMMUNITY): Admission: RE | Admit: 2023-03-10 | Payer: BC Managed Care – PPO | Source: Ambulatory Visit

## 2023-08-27 ENCOUNTER — Telehealth: Payer: Self-pay | Admitting: *Deleted

## 2023-08-27 NOTE — Telephone Encounter (Signed)
 Per reminder file: Thyroid ultrasound in April 2025 due to thyroid nodule

## 2023-08-28 ENCOUNTER — Encounter: Payer: Self-pay | Admitting: Family Medicine

## 2023-08-28 NOTE — Telephone Encounter (Signed)
 Letter was dictated to be sent certified mail please see the letter make sure a copy of her thyroid ultrasound from April 2024 is sent with

## 2023-08-28 NOTE — Progress Notes (Signed)
 Please send certified mail Please also send a copy of her ultrasound thyroid ultrasound from April 2024 with this letter thank you

## 2023-08-31 NOTE — Telephone Encounter (Signed)
 I have printed the ultrasound report please mail with a letter thank you so much

## 2023-09-16 ENCOUNTER — Other Ambulatory Visit (HOSPITAL_COMMUNITY): Payer: Self-pay | Admitting: Family Medicine

## 2023-09-16 DIAGNOSIS — Z1231 Encounter for screening mammogram for malignant neoplasm of breast: Secondary | ICD-10-CM

## 2023-09-25 ENCOUNTER — Ambulatory Visit (HOSPITAL_COMMUNITY)
Admission: RE | Admit: 2023-09-25 | Discharge: 2023-09-25 | Disposition: A | Discharge status: Home / Self Care | Source: Ambulatory Visit | Attending: Family Medicine | Admitting: Family Medicine

## 2023-09-25 ENCOUNTER — Encounter (HOSPITAL_COMMUNITY): Payer: Self-pay

## 2023-09-25 DIAGNOSIS — Z1231 Encounter for screening mammogram for malignant neoplasm of breast: Secondary | ICD-10-CM | POA: Insufficient documentation

## 2024-06-18 ENCOUNTER — Emergency Department (HOSPITAL_COMMUNITY): Payer: Self-pay

## 2024-06-18 ENCOUNTER — Other Ambulatory Visit: Payer: Self-pay

## 2024-06-18 ENCOUNTER — Encounter (HOSPITAL_COMMUNITY): Payer: Self-pay

## 2024-06-18 ENCOUNTER — Emergency Department (HOSPITAL_COMMUNITY)
Admission: EM | Admit: 2024-06-18 | Discharge: 2024-06-18 | Disposition: A | Discharge status: Home / Self Care | Payer: Self-pay | Attending: Emergency Medicine | Admitting: Emergency Medicine

## 2024-06-18 DIAGNOSIS — Z79899 Other long term (current) drug therapy: Secondary | ICD-10-CM | POA: Insufficient documentation

## 2024-06-18 DIAGNOSIS — R1010 Upper abdominal pain, unspecified: Secondary | ICD-10-CM | POA: Insufficient documentation

## 2024-06-18 DIAGNOSIS — R1013 Epigastric pain: Secondary | ICD-10-CM | POA: Insufficient documentation

## 2024-06-18 LAB — CBC WITH DIFFERENTIAL/PLATELET
Abs Immature Granulocytes: 0.11 K/uL — ABNORMAL HIGH (ref 0.00–0.07)
Basophils Absolute: 0.1 K/uL (ref 0.0–0.1)
Basophils Relative: 1 %
Eosinophils Absolute: 0.2 K/uL (ref 0.0–0.5)
Eosinophils Relative: 1 %
HCT: 43.2 % (ref 36.0–46.0)
Hemoglobin: 14.1 g/dL (ref 12.0–15.0)
Immature Granulocytes: 1 %
Lymphocytes Relative: 21 %
Lymphs Abs: 2.7 K/uL (ref 0.7–4.0)
MCH: 28.5 pg (ref 26.0–34.0)
MCHC: 32.6 g/dL (ref 30.0–36.0)
MCV: 87.3 fL (ref 80.0–100.0)
Monocytes Absolute: 1 K/uL (ref 0.1–1.0)
Monocytes Relative: 8 %
Neutro Abs: 8.6 K/uL — ABNORMAL HIGH (ref 1.7–7.7)
Neutrophils Relative %: 68 %
Platelets: 274 K/uL (ref 150–400)
RBC: 4.95 MIL/uL (ref 3.87–5.11)
RDW: 14.2 % (ref 11.5–15.5)
WBC: 12.7 K/uL — ABNORMAL HIGH (ref 4.0–10.5)
nRBC: 0 % (ref 0.0–0.2)

## 2024-06-18 LAB — COMPREHENSIVE METABOLIC PANEL WITH GFR
ALT: 50 U/L — ABNORMAL HIGH (ref 0–44)
AST: 35 U/L (ref 15–41)
Albumin: 4.5 g/dL (ref 3.5–5.0)
Alkaline Phosphatase: 161 U/L — ABNORMAL HIGH (ref 38–126)
Anion gap: 17 — ABNORMAL HIGH (ref 5–15)
BUN: 8 mg/dL (ref 6–20)
CO2: 19 mmol/L — ABNORMAL LOW (ref 22–32)
Calcium: 8.7 mg/dL — ABNORMAL LOW (ref 8.9–10.3)
Chloride: 104 mmol/L (ref 98–111)
Creatinine, Ser: 0.59 mg/dL (ref 0.44–1.00)
GFR, Estimated: >60 mL/min
Glucose, Bld: 194 mg/dL — ABNORMAL HIGH (ref 70–99)
Potassium: 3.7 mmol/L (ref 3.5–5.1)
Sodium: 140 mmol/L (ref 135–145)
Total Bilirubin: 0.6 mg/dL (ref 0.0–1.2)
Total Protein: 7.4 g/dL (ref 6.5–8.1)

## 2024-06-18 LAB — LIPASE, BLOOD: Lipase: 26 U/L (ref 11–51)

## 2024-06-18 MED ORDER — ONDANSETRON HCL 4 MG/2ML IJ SOLN
4.0000 mg | Freq: Once | INTRAMUSCULAR | Status: AC
  Administered 2024-06-18: 4 mg via INTRAVENOUS

## 2024-06-18 MED ORDER — OXYCODONE-ACETAMINOPHEN 5-325 MG PO TABS: ORAL_TABLET | ORAL | 0 refills | Status: AC

## 2024-06-18 MED ORDER — ONDANSETRON HCL 4 MG/2ML IJ SOLN
4.0000 mg | Freq: Once | INTRAMUSCULAR | Status: DC
  Filled 2024-06-18: qty 2

## 2024-06-18 MED ORDER — ONDANSETRON 4 MG PO TBDP: ORAL_TABLET | ORAL | 0 refills | Status: AC

## 2024-06-18 MED ORDER — PANTOPRAZOLE SODIUM 40 MG IV SOLR
40.0000 mg | Freq: Once | INTRAVENOUS | Status: AC
  Administered 2024-06-18: 40 mg via INTRAVENOUS
  Filled 2024-06-18: qty 10

## 2024-06-18 MED ORDER — IOHEXOL 300 MG/ML  SOLN
100.0000 mL | Freq: Once | INTRAMUSCULAR | Status: AC | PRN
  Administered 2024-06-18: 100 mL via INTRAVENOUS

## 2024-06-18 MED ORDER — PANTOPRAZOLE SODIUM 20 MG PO TBEC: 40.0000 mg | DELAYED_RELEASE_TABLET | Freq: Every day | ORAL | 0 refills | Status: AC

## 2024-06-18 MED ORDER — SODIUM CHLORIDE 0.9 % IV BOLUS
500.0000 mL | Freq: Once | INTRAVENOUS | Status: AC
  Administered 2024-06-18: 500 mL via INTRAVENOUS

## 2024-06-18 MED ORDER — HYDROMORPHONE HCL 1 MG/ML IJ SOLN
0.5000 mg | Freq: Once | INTRAMUSCULAR | Status: AC
  Administered 2024-06-18: 0.5 mg via INTRAVENOUS
  Filled 2024-06-18: qty 0.5

## 2024-06-18 NOTE — ED Triage Notes (Signed)
 Pt reports feeling like she has been having bad acid reflux and indigestion for several days and it got worse today.  Pain is epigastric and pepcid feels like it made it worse today.

## 2024-06-18 NOTE — Discharge Instructions (Addendum)
 Follow-up with Dr. Cindie or one of his colleagues next week for recheck.  Return if any problems

## 2024-06-20 ENCOUNTER — Encounter (HOSPITAL_BASED_OUTPATIENT_CLINIC_OR_DEPARTMENT_OTHER): Payer: Self-pay

## 2024-06-20 ENCOUNTER — Emergency Department (HOSPITAL_BASED_OUTPATIENT_CLINIC_OR_DEPARTMENT_OTHER)
Admission: EM | Admit: 2024-06-20 | Discharge: 2024-06-20 | Disposition: A | Discharge status: Home / Self Care | Payer: Self-pay | Attending: Emergency Medicine | Admitting: Emergency Medicine

## 2024-06-20 ENCOUNTER — Other Ambulatory Visit: Payer: Self-pay

## 2024-06-20 ENCOUNTER — Emergency Department (HOSPITAL_BASED_OUTPATIENT_CLINIC_OR_DEPARTMENT_OTHER): Payer: Self-pay

## 2024-06-20 DIAGNOSIS — R109 Unspecified abdominal pain: Secondary | ICD-10-CM | POA: Insufficient documentation

## 2024-06-20 DIAGNOSIS — K219 Gastro-esophageal reflux disease without esophagitis: Secondary | ICD-10-CM | POA: Insufficient documentation

## 2024-06-20 DIAGNOSIS — R112 Nausea with vomiting, unspecified: Secondary | ICD-10-CM | POA: Insufficient documentation

## 2024-06-20 LAB — COMPREHENSIVE METABOLIC PANEL WITH GFR
ALT: 45 U/L — ABNORMAL HIGH (ref 0–44)
AST: 34 U/L (ref 15–41)
Albumin: 4.5 g/dL (ref 3.5–5.0)
Alkaline Phosphatase: 158 U/L — ABNORMAL HIGH (ref 38–126)
Anion gap: 12 (ref 5–15)
BUN: 12 mg/dL (ref 6–20)
CO2: 24 mmol/L (ref 22–32)
Calcium: 9.2 mg/dL (ref 8.9–10.3)
Chloride: 102 mmol/L (ref 98–111)
Creatinine, Ser: 0.67 mg/dL (ref 0.44–1.00)
GFR, Estimated: >60 mL/min
Glucose, Bld: 161 mg/dL — ABNORMAL HIGH (ref 70–99)
Potassium: 3.8 mmol/L (ref 3.5–5.1)
Sodium: 139 mmol/L (ref 135–145)
Total Bilirubin: 0.8 mg/dL (ref 0.0–1.2)
Total Protein: 7.3 g/dL (ref 6.5–8.1)

## 2024-06-20 LAB — CBC WITH DIFFERENTIAL/PLATELET
Abs Immature Granulocytes: 0.04 K/uL (ref 0.00–0.07)
Basophils Absolute: 0.1 K/uL (ref 0.0–0.1)
Basophils Relative: 1 %
Eosinophils Absolute: 0.1 K/uL (ref 0.0–0.5)
Eosinophils Relative: 1 %
HCT: 40.8 % (ref 36.0–46.0)
Hemoglobin: 13.8 g/dL (ref 12.0–15.0)
Immature Granulocytes: 0 %
Lymphocytes Relative: 22 %
Lymphs Abs: 2.6 K/uL (ref 0.7–4.0)
MCH: 28.3 pg (ref 26.0–34.0)
MCHC: 33.8 g/dL (ref 30.0–36.0)
MCV: 83.8 fL (ref 80.0–100.0)
Monocytes Absolute: 0.7 K/uL (ref 0.1–1.0)
Monocytes Relative: 6 %
Neutro Abs: 8.2 K/uL — ABNORMAL HIGH (ref 1.7–7.7)
Neutrophils Relative %: 70 %
Platelets: 299 K/uL (ref 150–400)
RBC: 4.87 MIL/uL (ref 3.87–5.11)
RDW: 13.7 % (ref 11.5–15.5)
WBC: 11.7 K/uL — ABNORMAL HIGH (ref 4.0–10.5)
nRBC: 0 % (ref 0.0–0.2)

## 2024-06-20 LAB — LACTIC ACID, PLASMA: Lactic Acid, Venous: 1.4 mmol/L (ref 0.5–1.9)

## 2024-06-20 MED ORDER — SODIUM CHLORIDE 0.9 % IV SOLN: INTRAVENOUS | Status: DC

## 2024-06-20 MED ORDER — SODIUM CHLORIDE 0.9 % IV BOLUS
1000.0000 mL | Freq: Once | INTRAVENOUS | Status: AC
  Administered 2024-06-20: 1000 mL via INTRAVENOUS

## 2024-06-20 NOTE — ED Provider Notes (Signed)
 " [place] EMERGENCY DEPARTMENT AT [facility] Provider Note   CSN: [id number] Arrival date & time: 06/20/24  1341     Patient presents with: Heartburn   Samantha Moreno is a 54 y.o. female.   54 year old female presents with recurrent abdominal pain.  Denies epigastric radiates to her right upper or left upper quadrants.  Some nausea and vomiting x 1 but no fever or chills.  Seen at any pain hospital for similar symptoms 3 days ago.  That visit was reviewed.  Negative abdominal CT at that time.  Does have a history of GERD and was prescribed proton pump inhibitors which did not help her symptoms.  States that symptoms come and go with or without food.  Denies any diarrhea.       Prior to Admission medications  Medication Sig Start Date End Date Taking? Authorizing Provider  acetaminophen  (TYLENOL ) 325 MG tablet Take 650 mg by mouth every 6 (six) hours as needed for mild pain or moderate pain.    [provider]  HYDROcodone -acetaminophen  (NORCO/VICODIN) 5-325 MG tablet Take 1-2 tablets by mouth daily. 06/22/21   [provider]  ibuprofen (ADVIL,MOTRIN) 200 MG tablet Take 200 mg by mouth every 6 (six) hours as needed.    [provider]  losartan  (COZAAR ) 100 MG tablet Take 1 tablet (100 mg total) by mouth daily. 07/03/21   Nishan, Peter C, MD  metoprolol  succinate (TOPROL -XL) 100 MG 24 hr tablet Take 1 tablet (100 mg total) by mouth daily. 07/03/21   Nishan, Peter C, MD  naloxone [facility]) nasal spray 4 mg/0.1 mL as directed. 06/06/21   [provider]  ondansetron  (ZOFRAN -ODT) 4 MG disintegrating tablet 4mg  ODT q4 hours prn nausea/vomit 06/18/24   Zammit, Joseph, MD  oxyCODONE -acetaminophen  (PERCOCET/ROXICET) 5-325 MG tablet Take 1 every 6 hours as needed for severe pain 06/18/24   Suzette Pac, MD  pantoprazole  (PROTONIX ) 20 MG tablet Take 2 tablets (40 mg total) by mouth daily. 06/18/24   Suzette Pac, MD  promethazine  (PHENERGAN ) 25 MG  tablet TAKE 1 TABLET BY MOUTH EVERY 6 HOURS AS NEEDED FOR NAUSEA OR VOMITING 07/10/21   Alphonsa Glendia LABOR, MD  Vitamin D , Ergocalciferol , (DRISDOL ) 1.25 MG (50000 UNIT) CAPS capsule Take one tablet po each week for next 8 weeks 05/11/21   Alphonsa Glendia LABOR, MD    Allergies: Levaquin  [levofloxacin  in d5w], Amlodipine , and Augmentin [amoxicillin-pot clavulanate]    Review of Systems  All other systems reviewed and are negative.   Updated Vital Signs BP (!) 185/106   Pulse 90   Temp 98.1 F (36.7 C)   Resp 16   Ht 1.575 m (5' 2)   Wt 63.5 kg   LMP 09/07/2017   SpO2 97%   BMI 25.60 kg/m   Physical Exam Vitals and nursing note reviewed.  Constitutional:      General: She is not in acute distress.    Appearance: Normal appearance. She is well-developed. She is not toxic-appearing.  HENT:     Head: Normocephalic and atraumatic.  Eyes:     General: Lids are normal.     Conjunctiva/sclera: Conjunctivae normal.     Pupils: Pupils are equal, round, and reactive to light.  Neck:     Thyroid : No thyroid  mass.     Trachea: No tracheal deviation.  Cardiovascular:     Rate and Rhythm: Normal rate and regular rhythm.     Heart sounds: Normal heart sounds. No murmur heard.    No  gallop.  Pulmonary:     Effort: Pulmonary effort is normal. No respiratory distress.     Breath sounds: Normal breath sounds. No stridor. No decreased breath sounds, wheezing, rhonchi or rales.  Abdominal:     General: There is no distension.     Palpations: Abdomen is soft.     Tenderness: There is abdominal tenderness in the right upper quadrant, epigastric area and left upper quadrant. There is no rebound.  Musculoskeletal:        General: No tenderness. Normal range of motion.     Cervical back: Normal range of motion and neck supple.  Skin:    General: Skin is warm and dry.     Findings: No abrasion or rash.  Neurological:     Mental Status: She is alert and oriented to person, place, and time. Mental  status is at baseline.     GCS: GCS eye subscore is 4. GCS verbal subscore is 5. GCS motor subscore is 6.     Cranial Nerves: No cranial nerve deficit.     Sensory: No sensory deficit.     Motor: Motor function is intact.  Psychiatric:        Attention and Perception: Attention normal.        Speech: Speech normal.        Behavior: Behavior normal.     (all labs ordered are listed, but only abnormal results are displayed) Labs Reviewed  CBC WITH DIFFERENTIAL/PLATELET  COMPREHENSIVE METABOLIC PANEL WITH GFR  LACTIC ACID, PLASMA  LACTIC ACID, PLASMA    EKG: EKG Interpretation Date/Time:  Sunday June 20 2024 16:57:38 EST Ventricular Rate:  82 PR Interval:  158 QRS Duration:  98 QT Interval:  430 QTC Calculation: 503 R Axis:   -53  Text Interpretation: Sinus rhythm Left axis deviation Borderline T wave abnormalities Borderline prolonged QT interval No significant change since last tracing Confirmed by Dasie Faden ([id number]) on 06/20/2024 5:14:28 PM  Radiology: CT ABDOMEN PELVIS W CONTRAST Result Date: 06/18/2024 EXAM: CT ABDOMEN AND PELVIS WITH CONTRAST 06/18/2024 09:32:24 PM TECHNIQUE: CT of the abdomen and pelvis was performed with the administration of 100 mL of iohexol  (OMNIPAQUE ) 300 MG/ML solution. Multiplanar reformatted images are provided for review. Automated exposure control, iterative reconstruction, and/or weight-based adjustment of the mA/kV was utilized to reduce the radiation dose to as low as reasonably achievable. COMPARISON: 09/21/2017 CLINICAL HISTORY: Abdominal pain, acute, nonlocalized. FINDINGS: LOWER CHEST: No acute abnormality. LIVER: The liver is unremarkable. GALLBLADDER AND BILE DUCTS: Gallbladder is unremarkable. No biliary ductal dilatation. SPLEEN: No acute abnormality. PANCREAS: No acute abnormality. ADRENAL GLANDS: No acute abnormality. KIDNEYS, URETERS AND BLADDER: No stones in the kidneys or ureters. No hydronephrosis. No perinephric or periureteral  stranding. Urinary bladder is unremarkable. GI AND BOWEL: Stomach demonstrates no acute abnormality. Sigmoid diverticulosis, without evidence of diverticulitis. Normal appendix (image 57). There is no bowel obstruction. PERITONEUM AND RETROPERITONEUM: No ascites. No free air. VASCULATURE: Atherosclerotic calcifications of the abdominal aorta and branch vessels, although patent. LYMPH NODES: No lymphadenopathy. REPRODUCTIVE ORGANS: The uterus and bilateral ovaries are unremarkable. BONES AND SOFT TISSUES: No acute osseous abnormality. No focal soft tissue abnormality. IMPRESSION: 1. No acute findings in the abdomen or pelvis. Electronically signed by: Pinkie Pebbles MD MD 06/18/2024 09:38 PM EST RP Workstation: [id number]     Procedures   Medications Ordered in the ED  0.9 %  sodium chloride  infusion (has no administration in time range)  sodium chloride  0.9 % bolus 1,000 mL (has  no administration in time range)                                    Medical Decision Making Amount and/or Complexity of Data Reviewed Labs: ordered. Radiology: ordered. ECG/medicine tests: ordered.  Risk Prescription drug management.  Patient's EKG shows normal sinus rhythm.  No signs of acute ischemic changes.  Labs here are reassuring.  Ultrasound showed no evidence of gallbladder pathology.  Suspect that patient is likely having GERD as she does have a history of this.  Patient will need to follow-up with GI     Final diagnoses:  None    ED Discharge Orders     None          Dasie Faden, MD 06/20/24 1715  "

## 2024-06-20 NOTE — ED Triage Notes (Signed)
 Presents to ED with c/o heartburn. Was seen at [facility] on 1/9 and had full w/u including CT per pt. Sent home with protonix  and zofran . Pt states meds are not helping.

## 2024-06-20 NOTE — ED Provider Notes (Signed)
 " [place] EMERGENCY DEPARTMENT AT [facility] Provider Note   CSN: [id number] Arrival date & time: 06/18/24  1738     Patient presents with: Chest Pain   Samantha Moreno is a 54 y.o. female.   Patient complains of abdominal pain.  No vomiting no fever.  Patient's pain is more epigastric  The history is provided by the patient and medical records. No language interpreter was used.  Abdominal Pain Pain location:  Epigastric Pain quality: aching   Pain radiates to:  Does not radiate Pain severity:  Moderate Onset quality:  Sudden Timing:  Constant Progression:  Worsening Chronicity:  New Context: not alcohol use   Relieved by:  Nothing Associated symptoms: no chest pain, no cough, no diarrhea, no fatigue and no hematuria        Prior to Admission medications  Medication Sig Start Date End Date Taking? Authorizing Provider  ondansetron  (ZOFRAN -ODT) 4 MG disintegrating tablet 4mg  ODT q4 hours prn nausea/vomit 06/18/24  Yes Bradely Rudin, MD  oxyCODONE -acetaminophen  (PERCOCET/ROXICET) 5-325 MG tablet Take 1 every 6 hours as needed for severe pain 06/18/24  Yes Josedaniel Haye, MD  pantoprazole  (PROTONIX ) 20 MG tablet Take 2 tablets (40 mg total) by mouth daily. 06/18/24  Yes Kellin Fifer, MD  acetaminophen  (TYLENOL ) 325 MG tablet Take 650 mg by mouth every 6 (six) hours as needed for mild pain or moderate pain.    [provider]  HYDROcodone -acetaminophen  (NORCO/VICODIN) 5-325 MG tablet Take 1-2 tablets by mouth daily. 06/22/21   [provider]  ibuprofen (ADVIL,MOTRIN) 200 MG tablet Take 200 mg by mouth every 6 (six) hours as needed.    [provider]  losartan  (COZAAR ) 100 MG tablet Take 1 tablet (100 mg total) by mouth daily. 07/03/21   Nishan, Peter C, MD  metoprolol  succinate (TOPROL -XL) 100 MG 24 hr tablet Take 1 tablet (100 mg total) by mouth daily. 07/03/21   Nishan, Peter C, MD  naloxone [facility]) nasal spray 4 mg/0.1 mL as directed.  06/06/21   [provider]  promethazine  (PHENERGAN ) 25 MG tablet TAKE 1 TABLET BY MOUTH EVERY 6 HOURS AS NEEDED FOR NAUSEA OR VOMITING 07/10/21   Alphonsa Glendia LABOR, MD  Vitamin D , Ergocalciferol , (DRISDOL ) 1.25 MG (50000 UNIT) CAPS capsule Take one tablet po each week for next 8 weeks 05/11/21   Alphonsa Glendia LABOR, MD    Allergies: Levaquin  [levofloxacin  in d5w], Amlodipine , and Augmentin [amoxicillin-pot clavulanate]    Review of Systems  Constitutional:  Negative for appetite change and fatigue.  HENT:  Negative for congestion, ear discharge and sinus pressure.   Eyes:  Negative for discharge.  Respiratory:  Negative for cough.   Cardiovascular:  Negative for chest pain.  Gastrointestinal:  Positive for abdominal pain. Negative for diarrhea.  Genitourinary:  Negative for frequency and hematuria.  Musculoskeletal:  Negative for back pain.  Skin:  Negative for rash.  Neurological:  Negative for seizures and headaches.  Psychiatric/Behavioral:  Negative for hallucinations.     Updated Vital Signs BP (!) 179/106   Pulse 92   Temp 98 F (36.7 C) (Oral)   Resp 11   Wt 63.5 kg   LMP 09/07/2017   SpO2 96%   BMI 25.61 kg/m   Physical Exam Vitals and nursing note reviewed.  Constitutional:      Appearance: She is well-developed.  HENT:     Head: Normocephalic.     Nose: Nose normal.  Eyes:     General: No scleral  icterus.    Conjunctiva/sclera: Conjunctivae normal.  Neck:     Thyroid : No thyromegaly.  Cardiovascular:     Rate and Rhythm: Normal rate and regular rhythm.     Heart sounds: No murmur heard.    No friction rub. No gallop.  Pulmonary:     Breath sounds: No stridor. No wheezing or rales.  Chest:     Chest wall: No tenderness.  Abdominal:     General: There is no distension.     Tenderness: There is no abdominal tenderness. There is no rebound.     Comments: Tender epigastric  Musculoskeletal:        General: Normal range of motion.     Cervical back:  Neck supple.  Lymphadenopathy:     Cervical: No cervical adenopathy.  Skin:    Findings: No erythema or rash.  Neurological:     Mental Status: She is alert and oriented to person, place, and time.     Motor: No abnormal muscle tone.     Coordination: Coordination normal.  Psychiatric:        Behavior: Behavior normal.     (all labs ordered are listed, but only abnormal results are displayed) Labs Reviewed  CBC WITH DIFFERENTIAL/PLATELET - Abnormal; Notable for the following components:      Result Value   WBC 12.7 (*)    Neutro Abs 8.6 (*)    Abs Immature Granulocytes 0.11 (*)    All other components within normal limits  COMPREHENSIVE METABOLIC PANEL WITH GFR - Abnormal; Notable for the following components:   CO2 19 (*)    Glucose, Bld 194 (*)    Calcium 8.7 (*)    ALT 50 (*)    Alkaline Phosphatase 161 (*)    Anion gap 17 (*)    All other components within normal limits  LIPASE, BLOOD    EKG: EKG Interpretation Date/Time:  Friday June 18 2024 17:52:11 EST Ventricular Rate:  88 PR Interval:  157 QRS Duration:  96 QT Interval:  409 QTC Calculation: 495 R Axis:   -61  Text Interpretation: Sinus rhythm LAD, consider left anterior fascicular block RSR' in V1 or V2, right VCD or RVH Borderline prolonged QT interval Confirmed by Griselda Norris [phone]) on 06/19/2024 11:31:27 AM  Radiology: CT ABDOMEN PELVIS W CONTRAST Result Date: 06/18/2024 EXAM: CT ABDOMEN AND PELVIS WITH CONTRAST 06/18/2024 09:32:24 PM TECHNIQUE: CT of the abdomen and pelvis was performed with the administration of 100 mL of iohexol  (OMNIPAQUE ) 300 MG/ML solution. Multiplanar reformatted images are provided for review. Automated exposure control, iterative reconstruction, and/or weight-based adjustment of the mA/kV was utilized to reduce the radiation dose to as low as reasonably achievable. COMPARISON: 09/21/2017 CLINICAL HISTORY: Abdominal pain, acute, nonlocalized. FINDINGS: LOWER CHEST: No acute  abnormality. LIVER: The liver is unremarkable. GALLBLADDER AND BILE DUCTS: Gallbladder is unremarkable. No biliary ductal dilatation. SPLEEN: No acute abnormality. PANCREAS: No acute abnormality. ADRENAL GLANDS: No acute abnormality. KIDNEYS, URETERS AND BLADDER: No stones in the kidneys or ureters. No hydronephrosis. No perinephric or periureteral stranding. Urinary bladder is unremarkable. GI AND BOWEL: Stomach demonstrates no acute abnormality. Sigmoid diverticulosis, without evidence of diverticulitis. Normal appendix (image 57). There is no bowel obstruction. PERITONEUM AND RETROPERITONEUM: No ascites. No free air. VASCULATURE: Atherosclerotic calcifications of the abdominal aorta and branch vessels, although patent. LYMPH NODES: No lymphadenopathy. REPRODUCTIVE ORGANS: The uterus and bilateral ovaries are unremarkable. BONES AND SOFT TISSUES: No acute osseous abnormality. No focal soft tissue abnormality. IMPRESSION: 1. No  acute findings in the abdomen or pelvis. Electronically signed by: Pinkie Pebbles MD MD 06/18/2024 09:38 PM EST RP Workstation: [id number]     Procedures   Medications Ordered in the ED  HYDROmorphone  (DILAUDID ) injection 0.5 mg (0.5 mg Intravenous Given 06/18/24 1826)  pantoprazole  (PROTONIX ) injection 40 mg (40 mg Intravenous Given 06/18/24 1829)  sodium chloride  0.9 % bolus 500 mL (0 mLs Intravenous Stopped 06/18/24 2032)  ondansetron  (ZOFRAN ) injection 4 mg (4 mg Intravenous Given 06/18/24 1825)  iohexol  (OMNIPAQUE ) 300 MG/ML solution 100 mL (100 mLs Intravenous Contrast Given 06/18/24 2122)                                    Medical Decision Making Amount and/or Complexity of Data Reviewed Labs: ordered. Radiology: ordered. ECG/medicine tests: ordered.  Risk Prescription drug management.   Abdominal pain.  Probable GERD.  Patient is put on Protonix  and referred to GI     Final diagnoses:  Pain of upper abdomen    ED Discharge Orders          Ordered     pantoprazole  (PROTONIX ) 20 MG tablet  Daily        06/18/24 2200    ondansetron  (ZOFRAN -ODT) 4 MG disintegrating tablet        06/18/24 2200    oxyCODONE -acetaminophen  (PERCOCET/ROXICET) 5-325 MG tablet        06/18/24 2200               Suzette Pac, MD 06/20/24 1035  "
